# Patient Record
Sex: Male | Born: 1950 | Race: Black or African American | Hispanic: No | Smoking: Former smoker
Health system: Southern US, Community
[De-identification: ages and names within clinical notes are randomized; demographics above are authoritative.]

## PROBLEM LIST (undated history)

## (undated) DIAGNOSIS — C61 Malignant neoplasm of prostate: Secondary | ICD-10-CM

## (undated) DIAGNOSIS — F329 Major depressive disorder, single episode, unspecified: Secondary | ICD-10-CM

## (undated) DIAGNOSIS — Z72820 Sleep deprivation: Secondary | ICD-10-CM

## (undated) DIAGNOSIS — F419 Anxiety disorder, unspecified: Secondary | ICD-10-CM

## (undated) DIAGNOSIS — R569 Unspecified convulsions: Secondary | ICD-10-CM

## (undated) DIAGNOSIS — F32A Depression, unspecified: Secondary | ICD-10-CM

## (undated) HISTORY — PX: PROSTATE SURGERY: SHX751

---

## 1999-03-24 ENCOUNTER — Encounter: Payer: Self-pay | Admitting: Emergency Medicine

## 1999-03-24 ENCOUNTER — Emergency Department (HOSPITAL_COMMUNITY): Admission: EM | Admit: 1999-03-24 | Discharge: 1999-03-24 | Payer: Self-pay | Admitting: Emergency Medicine

## 1999-04-04 ENCOUNTER — Ambulatory Visit (HOSPITAL_BASED_OUTPATIENT_CLINIC_OR_DEPARTMENT_OTHER): Admission: RE | Admit: 1999-04-04 | Discharge: 1999-04-04 | Payer: Self-pay | Admitting: Orthopedic Surgery

## 2001-04-21 ENCOUNTER — Ambulatory Visit (HOSPITAL_COMMUNITY): Admission: RE | Admit: 2001-04-21 | Discharge: 2001-04-21 | Payer: Self-pay | Admitting: Orthopedic Surgery

## 2001-04-21 ENCOUNTER — Encounter: Payer: Self-pay | Admitting: Orthopedic Surgery

## 2002-02-04 ENCOUNTER — Encounter: Payer: Self-pay | Admitting: Emergency Medicine

## 2002-02-04 ENCOUNTER — Emergency Department (HOSPITAL_COMMUNITY): Admission: EM | Admit: 2002-02-04 | Discharge: 2002-02-04 | Payer: Self-pay | Admitting: Emergency Medicine

## 2010-04-09 ENCOUNTER — Emergency Department (HOSPITAL_COMMUNITY): Admission: EM | Admit: 2010-04-09 | Discharge: 2010-04-09 | Payer: Self-pay | Admitting: Emergency Medicine

## 2010-08-02 ENCOUNTER — Inpatient Hospital Stay (HOSPITAL_COMMUNITY): Admission: AC | Admit: 2010-08-02 | Discharge: 2010-08-09 | Payer: Self-pay | Source: Home / Self Care

## 2010-08-07 ENCOUNTER — Ambulatory Visit: Payer: Self-pay | Admitting: Physical Medicine & Rehabilitation

## 2011-02-23 LAB — RAPID URINE DRUG SCREEN, HOSP PERFORMED
Amphetamines: NOT DETECTED
Barbiturates: NOT DETECTED
Opiates: NOT DETECTED

## 2011-02-23 LAB — BASIC METABOLIC PANEL
BUN: 4 mg/dL — ABNORMAL LOW (ref 6–23)
BUN: 5 mg/dL — ABNORMAL LOW (ref 6–23)
CO2: 14 mEq/L — ABNORMAL LOW (ref 19–32)
CO2: 25 mEq/L (ref 19–32)
Calcium: 9.4 mg/dL (ref 8.4–10.5)
Calcium: 9.9 mg/dL (ref 8.4–10.5)
Chloride: 102 mEq/L (ref 96–112)
Chloride: 110 mEq/L (ref 96–112)
Creatinine, Ser: 0.87 mg/dL (ref 0.4–1.5)
Creatinine, Ser: 0.93 mg/dL (ref 0.4–1.5)
Creatinine, Ser: 1.06 mg/dL (ref 0.4–1.5)
GFR calc Af Amer: >60 mL/min (ref >60)
GFR calc non Af Amer: >60 mL/min (ref >60)
GFR calc non Af Amer: >60 mL/min (ref >60)
GFR calc non Af Amer: >60 mL/min (ref >60)
GFR calc non Af Amer: >60 mL/min (ref >60)
Glucose, Bld: 112 mg/dL — ABNORMAL HIGH (ref 70–99)
Glucose, Bld: 122 mg/dL — ABNORMAL HIGH (ref 70–99)
Glucose, Bld: 135 mg/dL — ABNORMAL HIGH (ref 70–99)
Potassium: 3.5 mEq/L (ref 3.5–5.1)
Sodium: 140 mEq/L (ref 135–145)

## 2011-02-23 LAB — URINALYSIS, ROUTINE W REFLEX MICROSCOPIC
Bilirubin Urine: NEGATIVE
Leukocytes, UA: NEGATIVE
Protein, ur: 30 mg/dL — AB
Specific Gravity, Urine: 1.016 (ref 1.005–1.030)
Urobilinogen, UA: 0.2 mg/dL (ref 0.0–1.0)
pH: 5.5 (ref 5.0–8.0)

## 2011-02-23 LAB — CBC
HCT: 38.4 % — ABNORMAL LOW (ref 39.0–52.0)
HCT: 42.1 % (ref 39.0–52.0)
Hemoglobin: 14.9 g/dL (ref 13.0–17.0)
Hemoglobin: 15.8 g/dL (ref 13.0–17.0)
MCH: 32.6 pg (ref 26.0–34.0)
MCH: 33.5 pg (ref 26.0–34.0)
MCHC: 35.2 g/dL (ref 30.0–36.0)
MCHC: 35.4 g/dL (ref 30.0–36.0)
MCHC: 35.4 g/dL (ref 30.0–36.0)
MCHC: 36.2 g/dL — ABNORMAL HIGH (ref 30.0–36.0)
MCV: 92.8 fL (ref 78.0–100.0)
MCV: 94.6 fL (ref 78.0–100.0)
RBC: 4.45 MIL/uL (ref 4.22–5.81)
RDW: 12.2 % (ref 11.5–15.5)
RDW: 12.5 % (ref 11.5–15.5)
RDW: 12.6 % (ref 11.5–15.5)
WBC: 13.5 10*3/uL — ABNORMAL HIGH (ref 4.0–10.5)

## 2011-02-23 LAB — URINE CULTURE: Culture  Setup Time: 201108250858

## 2011-02-23 LAB — TYPE AND SCREEN: ABO/RH(D): O POS

## 2011-02-23 LAB — COMPREHENSIVE METABOLIC PANEL
ALT: 29 U/L (ref 0–53)
AST: 64 U/L — ABNORMAL HIGH (ref 0–37)
Albumin: 4.4 g/dL (ref 3.5–5.2)
CO2: 18 mEq/L — ABNORMAL LOW (ref 19–32)
GFR calc Af Amer: >60 mL/min (ref >60)
GFR calc non Af Amer: 56 mL/min — ABNORMAL LOW (ref >60)
Sodium: 139 mEq/L (ref 135–145)

## 2011-02-23 LAB — ABO/RH: ABO/RH(D): O POS

## 2011-02-23 LAB — URINE MICROSCOPIC-ADD ON

## 2011-02-23 LAB — POCT I-STAT, CHEM 8
Creatinine, Ser: 1.7 mg/dL — ABNORMAL HIGH (ref 0.4–1.5)
HCT: 48 % (ref 39.0–52.0)
TCO2: 15 mmol/L (ref 0–100)

## 2011-02-23 LAB — ETHANOL: Alcohol, Ethyl (B): 219 mg/dL — ABNORMAL HIGH (ref 0–10)

## 2011-02-23 LAB — PROTIME-INR: Prothrombin Time: 13.2 seconds (ref 11.6–15.2)

## 2011-02-27 LAB — POCT I-STAT 3, ART BLOOD GAS (G3+)
Acid-base deficit: 2 mmol/L (ref 0.0–2.0)
pCO2 arterial: 35.4 mmHg (ref 35.0–45.0)
pO2, Arterial: 178 mmHg — ABNORMAL HIGH (ref 80.0–100.0)

## 2011-02-27 LAB — CARBOXYHEMOGLOBIN: Carboxyhemoglobin: 1.4 % (ref 0.5–1.5)

## 2011-04-27 NOTE — H&P (Signed)
[place]  Patient:    Roger Savage, Roger Savage Visit Number: [id number] MRN: [id number]          Service Type: MED Location: [id number] Attending Physician:  Roger Savage Dictated by:   Roger Savage, M.D. Admit Date:  02/04/2002                           History and Physical  CHIEF COMPLAINT: Larey Seat, hit head, passed out, confused.  HISTORY OF PRESENT ILLNESS: Roger Savage is a 60 year old black male, who was line at [facility] [employer] when he slipped on a rug and fell backward and hit his head on a hard surface floor.  He felt he would pass out, stool up and walked outside, and he did pass out.  He had some vomiting after this.  He was transported to [facility] Emergency Room by EMS.  Was noted to be very slow to respond or inappropriate, and decision was made to admit because of his examination.  He does complain of some blurred vision, headache, and slow thinking.  PAST MEDICAL HISTORY: Left hand surgery in 2000 by Dr. Teressa Savage for a comminuted fracture.  ALLERGIES: NKDA.  MEDICATIONS: None.  FAMILY HISTORY: Negative for diabetes, hypertension, CAD, asthma, cancer, or stroke.  SOCIAL HISTORY: He works for a [employer].  Single.  Three children, ages 32, 72, and 69.  He smokes one pack of cigarettes per week.  Rare alcohol.  No drugs.  REVIEW OF SYSTEMS: Positive for headache, positive for blurred vision, positive for nausea and vomiting after the fall as well as left arm weakness. He has no complaints of rhinorrhea, shortness of breath, chest pain, abdominal pain, diarrhea, BRBPR, edema, rash, loss of appetite, dysuria, frequency, hematuria, or fever.  PHYSICAL EXAMINATION:  VITAL SIGNS: TEMP 97.0 degrees, BP 152/101, pulse 61, respirations 18.  Pulse oximetry 100% on room air.  GENERAL: NAD.  SKIN: No lesions. Warm, dry.  HEENT: TMs clear.  EOMI.  No papilledema.  No conjunctivae injection. Oropharynx  clear.  Good dentition.  Moist mucosa.  No erythema or exudate.  NECK: Slight decrease in forward flexion.  Mild posterior neck pain with head movement.  Slight tenderness posteriorly.  No TM or adenopathy.  No JVD or bruits.  LUNGS: Clear to auscultation bilaterally.  No wheezes or crackles.  Good air movement.  BACK: No CVAT.  CV: RRR.  S1 and S2.  No MHR.  ABDOMEN: Positive bowel sounds, NT/ND.  No HSM or masses.  GU: NEMG.  Circumcised.  Descended testicles.  No hernia, no masses.  RECTAL: Guaiac negative.  Normal tone.  Firm, small prostate, nontender, no masses or nodules.  EXTREMITIES: Pulses 2+.  No CCE.  NEUROLOGIC: Alert and oriented to person, place, month and year, but takes him about two minutes to remember the month and year together.  Cranial nerves II-XII intact.  Motor 5/5.  Decreased sensation to fine touch to the left hand and distal forearm, left foot and left ankle.  Left blurred vision.  Downgoing right toe.  Equivocal left toe.  LABORATORY DATA: Hemoglobin 14.5, hematocrit 41.5, WBC 4.4; platelets 191,000. Drug screen positive for cocaine.  UA shows 500 glucose, otherwise normal.  CT read as normal by radiologist.  EKG, normal sinus rhythm.  ASSESSMENT/PLAN: This is a 60 year old with concussion, definite findings on neurologic examination to point to possible right cerebral deficit.  He will be admitted,  monitored, and MRI follow-up for further evaluation - especially with the weakness on the left and blurred vision on the left.  Frequent neurologic checks.  Also, blood pressure and glucose are to be monitored. Dictated by:   Roger Savage, M.D. Attending Physician:  Roger Savage DD:  02/04/02 TD:  02/05/02 Job: [id number]

## 2012-07-06 ENCOUNTER — Encounter (HOSPITAL_COMMUNITY): Payer: Self-pay | Admitting: *Deleted

## 2012-07-06 ENCOUNTER — Emergency Department (HOSPITAL_COMMUNITY)
Admission: EM | Admit: 2012-07-06 | Discharge: 2012-07-06 | Disposition: A | Discharge status: Home / Self Care | Payer: Self-pay | Attending: Emergency Medicine | Admitting: Emergency Medicine

## 2012-07-06 DIAGNOSIS — R350 Frequency of micturition: Secondary | ICD-10-CM | POA: Insufficient documentation

## 2012-07-06 DIAGNOSIS — R300 Dysuria: Secondary | ICD-10-CM

## 2012-07-06 DIAGNOSIS — R109 Unspecified abdominal pain: Secondary | ICD-10-CM | POA: Insufficient documentation

## 2012-07-06 DIAGNOSIS — R32 Unspecified urinary incontinence: Secondary | ICD-10-CM | POA: Insufficient documentation

## 2012-07-06 HISTORY — DX: Malignant neoplasm of prostate: C61

## 2012-07-06 HISTORY — DX: Depression, unspecified: F32.A

## 2012-07-06 HISTORY — DX: Major depressive disorder, single episode, unspecified: F32.9

## 2012-07-06 HISTORY — DX: Anxiety disorder, unspecified: F41.9

## 2012-07-06 HISTORY — DX: Sleep deprivation: Z72.820

## 2012-07-06 LAB — URINE MICROSCOPIC-ADD ON

## 2012-07-06 LAB — URINALYSIS, ROUTINE W REFLEX MICROSCOPIC
Bilirubin Urine: NEGATIVE
Glucose, UA: NEGATIVE mg/dL
Specific Gravity, Urine: 1.019 (ref 1.005–1.030)
Urobilinogen, UA: 0.2 mg/dL (ref 0.0–1.0)

## 2012-07-06 LAB — POCT I-STAT, CHEM 8
BUN: 12 mg/dL (ref 6–23)
Calcium, Ion: 1.21 mmol/L (ref 1.12–1.23)
Chloride: 107 mEq/L (ref 96–112)
Creatinine, Ser: 1.1 mg/dL (ref 0.50–1.35)
Glucose, Bld: 98 mg/dL (ref 70–99)
Potassium: 4.6 mEq/L (ref 3.5–5.1)

## 2012-07-06 MED ORDER — SULFAMETHOXAZOLE-TRIMETHOPRIM 800-160 MG PO TABS: 1.0000 | ORAL_TABLET | Freq: Two times a day (BID) | ORAL | Status: AC

## 2012-07-06 MED ORDER — SULFAMETHOXAZOLE-TMP DS 800-160 MG PO TABS
1.0000 | ORAL_TABLET | Freq: Once | ORAL | Status: AC
  Administered 2012-07-06: 1 via ORAL
  Filled 2012-07-06: qty 1

## 2012-07-06 MED ORDER — OXYCODONE-ACETAMINOPHEN 5-325 MG PO TABS: 2.0000 | ORAL_TABLET | ORAL | Status: AC | PRN

## 2012-07-06 MED ORDER — MORPHINE SULFATE 4 MG/ML IJ SOLN
4.0000 mg | Freq: Once | INTRAMUSCULAR | Status: AC
  Administered 2012-07-06: 4 mg via INTRAMUSCULAR
  Filled 2012-07-06: qty 1

## 2012-07-06 NOTE — ED Provider Notes (Addendum)
History     CSN: [id number]  Arrival date & time 07/06/12  1249   First MD Initiated Contact with Patient 07/06/12 1337      Chief Complaint  Patient presents with  . Urinary Frequency    Post prostate surgery March 31, 2012  . Dysuria    (Consider location/radiation/quality/duration/timing/severity/associated sxs/prior treatment) Patient is a 61 y.o. male presenting with frequency and dysuria. The history is provided by the patient and the spouse.  Urinary Frequency Associated symptoms include abdominal pain. Pertinent negatives include no headaches.  Dysuria  Associated symptoms include frequency. Pertinent negatives include no chills, no nausea, no vomiting and no hematuria.  PT had  Prostate surgery in April at the [place].  Now he has lower abd pain along with intermittent inability to void followed by severe dysuria.  He denies hematuria.  He denies f/c/n/v.  He wears adult diapers because he is incontinent now.  He also has erectile dysfx since the surgery.    Past Medical History  Diagnosis Date  . Prostate cancer   . Anxiety   . Depression   . Sleep deprivation     Past Surgical History  Procedure Date  . Prostate surgery     No family history on file.  History  Substance Use Topics  . Smoking status: Former Games developer  . Smokeless tobacco: Not on file  . Alcohol Use: 3.6 oz/week    6 Cans of beer per week      Review of Systems  Constitutional: Negative for fever and chills.  Gastrointestinal: Positive for abdominal pain. Negative for nausea and vomiting.  Genitourinary: Positive for dysuria and frequency. Negative for hematuria.  Musculoskeletal: Negative for back pain.  Neurological: Negative for headaches.  Psychiatric/Behavioral: Negative for confusion.  All other systems reviewed and are negative.    Allergies  Review of patient's allergies indicates no known allergies.  Home Medications   Current Outpatient Rx  Name Route Sig Dispense Refill    . VITAMIN D 1000 UNITS PO TABS Oral Take 2,000 Units by mouth daily.      BP 135/100  Pulse 85  Temp 98.5 F (36.9 C) (Oral)  Resp 19  Wt 170 lb (77.111 kg)  SpO2 99%  Physical Exam  Nursing note and vitals reviewed. Constitutional: He is oriented to person, place, and time. He appears well-developed and well-nourished. No distress.  HENT:  Head: Normocephalic and atraumatic.  Eyes: EOM are normal.  Neck: Normal range of motion.  Pulmonary/Chest: Effort normal.  Abdominal: Soft. He exhibits no distension. There is tenderness. There is no rebound and no guarding.       "pressure" over suprapubic area with palpation No peritoneal signs  Genitourinary: Penis normal. No penile tenderness.  Musculoskeletal: Normal range of motion.  Neurological: He is alert and oriented to person, place, and time.  Skin: Skin is warm and dry.  Psychiatric: He has a normal mood and affect. Thought content normal.    ED Course  Procedures (including critical care time)post prostate surgery urinary incontinence and dysuria   Labs Reviewed  URINALYSIS, ROUTINE W REFLEX MICROSCOPIC   No results found.   No diagnosis found.    MDM  Incontinence Dysuria with pyuria.  Will place on abxs.   ucx pending.   Pain controlled in ed. No signs systemic illness.        Cheri Guppy, MD 07/06/12 1413  Cheri Guppy, MD 07/06/12 [phone]

## 2012-07-06 NOTE — ED Notes (Signed)
Pt states "had prostate surgery @ the [place] in April, blood was coming, had a burning sensation down there, it finally broke free this morning, can't control it, just peed on myself"

## 2012-07-06 NOTE — ED Notes (Signed)
Pt aware of the need for a urine sample. 

## 2012-07-08 ENCOUNTER — Encounter (HOSPITAL_COMMUNITY): Payer: Self-pay | Admitting: *Deleted

## 2012-07-08 ENCOUNTER — Emergency Department (HOSPITAL_COMMUNITY)
Admission: EM | Admit: 2012-07-08 | Discharge: 2012-07-08 | Disposition: A | Discharge status: Home / Self Care | Payer: Self-pay | Attending: Emergency Medicine | Admitting: Emergency Medicine

## 2012-07-08 DIAGNOSIS — Z87891 Personal history of nicotine dependence: Secondary | ICD-10-CM | POA: Insufficient documentation

## 2012-07-08 DIAGNOSIS — C61 Malignant neoplasm of prostate: Secondary | ICD-10-CM | POA: Insufficient documentation

## 2012-07-08 DIAGNOSIS — R339 Retention of urine, unspecified: Secondary | ICD-10-CM | POA: Insufficient documentation

## 2012-07-08 DIAGNOSIS — F341 Dysthymic disorder: Secondary | ICD-10-CM | POA: Insufficient documentation

## 2012-07-08 LAB — POCT I-STAT, CHEM 8
Chloride: 110 mEq/L (ref 96–112)
Creatinine, Ser: 1.4 mg/dL — ABNORMAL HIGH (ref 0.50–1.35)
Glucose, Bld: 104 mg/dL — ABNORMAL HIGH (ref 70–99)
Hemoglobin: 17 g/dL (ref 13.0–17.0)
Potassium: 4.3 mEq/L (ref 3.5–5.1)
Sodium: 142 mEq/L (ref 135–145)

## 2012-07-08 LAB — URINE MICROSCOPIC-ADD ON

## 2012-07-08 LAB — URINALYSIS, ROUTINE W REFLEX MICROSCOPIC
Bilirubin Urine: NEGATIVE
Glucose, UA: NEGATIVE mg/dL
Specific Gravity, Urine: 1.021 (ref 1.005–1.030)
Urobilinogen, UA: 0.2 mg/dL (ref 0.0–1.0)
pH: 6.5 (ref 5.0–8.0)

## 2012-07-08 MED ORDER — OXYCODONE-ACETAMINOPHEN 5-325 MG PO TABS: 2.0000 | ORAL_TABLET | ORAL | Status: AC | PRN

## 2012-07-08 MED ORDER — LIDOCAINE HCL 2 % EX GEL
Freq: Once | CUTANEOUS | Status: AC
  Administered 2012-07-08: 15:00:00 via URETHRAL
  Filled 2012-07-08: qty 10

## 2012-07-08 MED ORDER — MORPHINE SULFATE 4 MG/ML IJ SOLN
8.0000 mg | Freq: Once | INTRAMUSCULAR | Status: AC
  Administered 2012-07-08: 8 mg via INTRAMUSCULAR
  Filled 2012-07-08: qty 2

## 2012-07-08 NOTE — ED Notes (Signed)
MD at bedside. 

## 2012-07-08 NOTE — Progress Notes (Signed)
CM spoke with pt who confirms self pay [place] resident with no pcp. Discussed the importance of a pcp for f/u. Reviewed Health connect number to assist with finding self pay provider close to pt's residence. Reviewed resources for [facility], general medial clinics, medications-needymeds.[email or web address], housing, DSS, health Department and other resources in [place] including crisis programs Pt voiced understanding and appreciation of resources provided 

## 2012-07-08 NOTE — ED Notes (Signed)
Pt reports pain r/t prostate Ca since 4am today. Sts began leaking urine approx 1.5 hrs ago. Denies blood in urine.

## 2012-07-08 NOTE — ED Notes (Signed)
Dr. Ethelda Chick placed a 16 french coude in the patient at 1510.

## 2012-07-08 NOTE — ED Notes (Signed)
Pt reports pain, diaphoresis and elevated b/p. Feels dizzy/lightheaded

## 2012-07-08 NOTE — ED Provider Notes (Signed)
History     CSN: [id number]  Arrival date & time 07/08/12  1250   First MD Initiated Contact with Patient 07/08/12 1403      Chief Complaint  Patient presents with  . prostate cancer pain     (Consider location/radiation/quality/duration/timing/severity/associated sxs/prior treatment) HPI Complains of difficulty urinating and feeling of inability to add the bladder for 2 days. He is dribbling small amounts of urine. No other associated symptoms no fever no treatment prior to coming here nothing makes symptoms better or worse. Complains of pain at suprapubic area. Pressure-like in quality and severe at present Past Medical History  Diagnosis Date  . Prostate cancer   . Anxiety   . Depression   . Sleep deprivation     Past Surgical History  Procedure Date  . Prostate surgery     No family history on file.  History  Substance Use Topics  . Smoking status: Former Games developer  . Smokeless tobacco: Not on file  . Alcohol Use: 3.6 oz/week    6 Cans of beer per week      Review of Systems  Constitutional: Negative.   HENT: Negative.   Respiratory: Negative.   Cardiovascular: Negative.   Gastrointestinal: Negative.   Genitourinary: Positive for dysuria and difficulty urinating.  Musculoskeletal: Negative.   Skin: Negative.   Neurological: Negative.   Hematological: Negative.   Psychiatric/Behavioral: Negative.     Allergies  Review of patient's allergies indicates no known allergies.  Home Medications   Current Outpatient Rx  Name Route Sig Dispense Refill  . OXYCODONE-ACETAMINOPHEN 5-325 MG PO TABS Oral Take 2 tablets by mouth every 4 (four) hours as needed for pain. 6 tablet 0  . SULFAMETHOXAZOLE-TRIMETHOPRIM 800-160 MG PO TABS Oral Take 1 tablet by mouth every 12 (twelve) hours. 20 tablet 0  . VITAMIN D 1000 UNITS PO TABS Oral Take 2,000 Units by mouth daily.      BP 176/98  Pulse 107  Temp 97.8 F (36.6 C) (Oral)  Resp 24  SpO2 100%  Physical Exam    Nursing note and vitals reviewed. Constitutional: He appears well-developed and well-nourished. He appears distressed.       Appears uncomfortable  HENT:  Head: Normocephalic and atraumatic.  Eyes: Conjunctivae are normal. Pupils are equal, round, and reactive to light.  Neck: Neck supple. No tracheal deviation present. No thyromegaly present.  Cardiovascular: Normal rate and regular rhythm.   No murmur heard. Pulmonary/Chest: Effort normal and breath sounds normal.  Abdominal: Soft. Bowel sounds are normal. He exhibits no distension. There is no tenderness.  Genitourinary: Penis normal.  Musculoskeletal: Normal range of motion. He exhibits no edema and no tenderness.  Neurological: He is alert. Coordination normal.  Skin: Skin is warm and dry. No rash noted.  Psychiatric: He has a normal mood and affect.    ED Course  Procedures (including critical care time)  Labs Reviewed  GLUCOSE, CAPILLARY - Abnormal; Notable for the following:    Glucose-Capillary 110 (*)     All other components within normal limits  URINALYSIS, ROUTINE W REFLEX MICROSCOPIC   No results found. Procedure :ED tech unable to insert foley. I inserted 16 Frcoude catheter after area prepped wih betadynnne , draped urethra injected with lidocaine jelly. baollon iflated to 10 ml with sterile water. 350 ml urinee came out of foley with relief. No diagnosis found.  Results for orders placed during the hospital encounter of 07/08/12  GLUCOSE, CAPILLARY      Component Value Range  Glucose-Capillary 110 (*) 70 - 99 mg/dL  URINALYSIS, ROUTINE W REFLEX MICROSCOPIC      Component Value Range   Color, Urine YELLOW  YELLOW   APPearance CLEAR  CLEAR   Specific Gravity, Urine 1.021  1.005 - 1.030   pH 6.5  5.0 - 8.0   Glucose, UA NEGATIVE  NEGATIVE mg/dL   Hgb urine dipstick SMALL (*) NEGATIVE   Bilirubin Urine NEGATIVE  NEGATIVE   Ketones, ur NEGATIVE  NEGATIVE mg/dL   Protein, ur NEGATIVE  NEGATIVE mg/dL    Urobilinogen, UA 0.2  0.0 - 1.0 mg/dL   Nitrite NEGATIVE  NEGATIVE   Leukocytes, UA MODERATE (*) NEGATIVE  POCT I-STAT, CHEM 8      Component Value Range   Sodium 142  135 - 145 mEq/L   Potassium 4.3  3.5 - 5.1 mEq/L   Chloride 110  96 - 112 mEq/L   BUN 11  6 - 23 mg/dL   Creatinine, Ser 4.69 (*) 0.50 - 1.35 mg/dL   Glucose, Bld 629 (*) 70 - 99 mg/dL   Calcium, Ion 5.28  4.13 - 1.23 mmol/L   TCO2 21  0 - 100 mmol/L   Hemoglobin 17.0  13.0 - 17.0 g/dL   HCT 24.4  01.0 - 27.2 %  URINE MICROSCOPIC-ADD ON      Component Value Range   Squamous Epithelial / LPF RARE  RARE   WBC, UA 11-20  <3 WBC/hpf   RBC / HPF 3-6  <3 RBC/hpf   Bacteria, UA RARE  RARE   No results found.   MDM  Plan continue bactrim, rx percocet, foley to go, urology f/u within 1 weeks Dx #1 urinary retention  #2 renal insufficiciiency        Doug Sou, MD 07/08/12 [phone]

## 2012-07-09 LAB — URINE CULTURE: Special Requests: NORMAL

## 2012-07-10 NOTE — ED Notes (Signed)
+  Urine. Patient treated with Septra DS. Sensitive to same. Per protocol MD. °

## 2013-05-05 ENCOUNTER — Encounter (HOSPITAL_COMMUNITY): Payer: Self-pay

## 2013-05-05 ENCOUNTER — Emergency Department (HOSPITAL_COMMUNITY)
Admission: EM | Admit: 2013-05-05 | Discharge: 2013-05-05 | Disposition: A | Discharge status: Home / Self Care | Payer: Self-pay | Attending: Emergency Medicine | Admitting: Emergency Medicine

## 2013-05-05 DIAGNOSIS — F911 Conduct disorder, childhood-onset type: Secondary | ICD-10-CM | POA: Insufficient documentation

## 2013-05-05 DIAGNOSIS — Z87891 Personal history of nicotine dependence: Secondary | ICD-10-CM | POA: Insufficient documentation

## 2013-05-05 DIAGNOSIS — C61 Malignant neoplasm of prostate: Secondary | ICD-10-CM | POA: Insufficient documentation

## 2013-05-05 DIAGNOSIS — F329 Major depressive disorder, single episode, unspecified: Secondary | ICD-10-CM | POA: Insufficient documentation

## 2013-05-05 DIAGNOSIS — F411 Generalized anxiety disorder: Secondary | ICD-10-CM | POA: Insufficient documentation

## 2013-05-05 DIAGNOSIS — Z79899 Other long term (current) drug therapy: Secondary | ICD-10-CM | POA: Insufficient documentation

## 2013-05-05 DIAGNOSIS — R300 Dysuria: Secondary | ICD-10-CM | POA: Insufficient documentation

## 2013-05-05 DIAGNOSIS — Z72820 Sleep deprivation: Secondary | ICD-10-CM | POA: Insufficient documentation

## 2013-05-05 DIAGNOSIS — F3289 Other specified depressive episodes: Secondary | ICD-10-CM | POA: Insufficient documentation

## 2013-05-05 LAB — URINE MICROSCOPIC-ADD ON

## 2013-05-05 LAB — URINALYSIS, ROUTINE W REFLEX MICROSCOPIC
Glucose, UA: NEGATIVE mg/dL
Specific Gravity, Urine: 1.019 (ref 1.005–1.030)
pH: 6.5 (ref 5.0–8.0)

## 2013-05-05 MED ORDER — PHENAZOPYRIDINE HCL 200 MG PO TABS: 200.0000 mg | ORAL_TABLET | Freq: Three times a day (TID) | ORAL | Status: DC | PRN

## 2013-05-05 MED ORDER — TRAMADOL HCL 50 MG PO TABS: 50.0000 mg | ORAL_TABLET | Freq: Four times a day (QID) | ORAL | Status: DC | PRN

## 2013-05-05 MED ORDER — HYDROCODONE-ACETAMINOPHEN 5-325 MG PO TABS
1.0000 | ORAL_TABLET | Freq: Once | ORAL | Status: AC
  Administered 2013-05-05: 1 via ORAL
  Filled 2013-05-05: qty 1

## 2013-05-05 NOTE — ED Notes (Signed)
Roger Savage unable to pass coude catheter-- Dr. Ranae Palms aware.

## 2013-05-05 NOTE — ED Notes (Addendum)
Pt presents with trouble urinating. Pt says he has been unable to fully urinate since Friday. Pt says he has only been "leaking" since Friday of last week. Pt says that today the "leaking" has almost completely stopped and he is only "dripping" at this point. Pt says he is feeling pain in his penis and rectum right now.

## 2013-05-05 NOTE — ED Notes (Signed)
Attempted to insert 30fr foley catheter. Resistance met multiple times. Foley removed. Dr. Ranae Palms made aware. Verbal order given to insert coude cath if department RN's could assist. Rayford Halsted RN in to attempt coude insertion.

## 2013-05-05 NOTE — ED Provider Notes (Signed)
History     CSN: [id number]  Arrival date & time 05/05/13  1632   First MD Initiated Contact with Patient 05/05/13 1656      Chief Complaint  Patient presents with  . Urinary Retention    (Consider location/radiation/quality/duration/timing/severity/associated sxs/prior treatment) HPI Pt present demanding catheter be placed. He is having ongoing urinary incontinence and dribbling. He is s/p prostate resection and is followed by the [place]. He had catheter up until several months ago. No fever, chills. He has chronic pain. States he urinated prior to arriving in ED.  Past Medical History  Diagnosis Date  . Prostate cancer   . Anxiety   . Depression   . Sleep deprivation     Past Surgical History  Procedure Laterality Date  . Prostate surgery      No family history on file.  History  Substance Use Topics  . Smoking status: Former Games developer  . Smokeless tobacco: Not on file  . Alcohol Use: 3.6 oz/week    6 Cans of beer per week      Review of Systems  Constitutional: Negative for fever and chills.  Gastrointestinal: Negative for nausea, vomiting, abdominal pain and diarrhea.  Genitourinary: Positive for difficulty urinating. Negative for dysuria and hematuria.  Musculoskeletal: Negative for back pain.  Skin: Negative for rash and wound.  All other systems reviewed and are negative.    Allergies  Review of patient's allergies indicates no known allergies.  Home Medications   Current Outpatient Rx  Name  Route  Sig  Dispense  Refill  . cholecalciferol (VITAMIN D) 1000 UNITS tablet   Oral   Take 2,000 Units by mouth daily.         Marland Kitchen FLUoxetine (PROZAC) 40 MG capsule   Oral   Take 40 mg by mouth daily.         Marland Kitchen HYDROcodone-acetaminophen (NORCO/VICODIN) 5-325 MG per tablet   Oral   Take 1 tablet by mouth every 6 (six) hours as needed for pain.         . traZODone (DESYREL) 150 MG tablet   Oral   Take 150 mg by mouth at bedtime.         .  phenazopyridine (PYRIDIUM) 200 MG tablet   Oral   Take 1 tablet (200 mg total) by mouth 3 (three) times daily as needed for pain.   6 tablet   0   . traMADol (ULTRAM) 50 MG tablet   Oral   Take 1 tablet (50 mg total) by mouth every 6 (six) hours as needed for pain.   15 tablet   0     BP 140/79  Pulse 59  Temp(Src) 98.6 F (37 C) (Oral)  Resp 24  Ht 5\' 9"  (1.753 m)  Wt 170 lb (77.111 kg)  BMI 25.09 kg/m2  SpO2 99%  Physical Exam  Nursing note and vitals reviewed. Constitutional: He is oriented to person, place, and time. He appears well-developed and well-nourished. No distress.  HENT:  Head: Normocephalic and atraumatic.  Mouth/Throat: Oropharynx is clear and moist.  Eyes: EOM are normal. Pupils are equal, round, and reactive to light.  Neck: Normal range of motion. Neck supple.  Cardiovascular: Normal rate and regular rhythm.   Pulmonary/Chest: Effort normal and breath sounds normal. No respiratory distress. He has no wheezes. He has no rales.  Abdominal: Soft. Bowel sounds are normal. He exhibits no distension and no mass. There is no tenderness. There is no rebound and no guarding.  Genitourinary: Penis normal.  Musculoskeletal: Normal range of motion. He exhibits no edema and no tenderness.  Neurological: He is alert and oriented to person, place, and time.  Skin: Skin is warm and dry. No rash noted. No erythema.  Psychiatric:  Pt is aggressive and demanding.     ED Course  Procedures (including critical care time)  Labs Reviewed  URINALYSIS, ROUTINE W REFLEX MICROSCOPIC - Abnormal; Notable for the following:    APPearance CLOUDY (*)    Hgb urine dipstick LARGE (*)    Protein, ur 100 (*)    Leukocytes, UA SMALL (*)    All other components within normal limits  URINE MICROSCOPIC-ADD ON - Abnormal; Notable for the following:    Squamous Epithelial / LPF FEW (*)    Bacteria, UA FEW (*)    All other components within normal limits  URINE CULTURE   No  results found.   1. Dysuria       MDM  Bladder scan reveals approx 115 cc in bladder. Will attempt catheter, check urine for infection and [facility].  Unsuccessful passage of cath and coude cath. Pt continues to urinate in ED. UA neg for infection. Discussed with Dr Isabel Caprice. 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 Suggest pyridium and if he wants a second opinion can f/u with him as an outpatient but suggests he continue with the [facility] urologist.   Pt and his sister were angry and demanding someone be called in and demanding pain medication. Pt stood in threatening manner making aggressive gestures. Will give single dose of hydrocodone and d/c home.        Loren Racer, MD 05/05/13 [phone]

## 2013-05-06 LAB — URINE CULTURE

## 2014-01-09 ENCOUNTER — Inpatient Hospital Stay (HOSPITAL_COMMUNITY)
Admission: EM | Admit: 2014-01-09 | Discharge: 2014-01-12 | DRG: 101 | Disposition: A | Discharge status: Home / Self Care | Payer: Non-veteran care | Attending: Internal Medicine | Admitting: Internal Medicine

## 2014-01-09 ENCOUNTER — Emergency Department (HOSPITAL_COMMUNITY): Payer: Non-veteran care

## 2014-01-09 ENCOUNTER — Encounter (HOSPITAL_COMMUNITY): Payer: Self-pay | Admitting: Emergency Medicine

## 2014-01-09 DIAGNOSIS — F329 Major depressive disorder, single episode, unspecified: Secondary | ICD-10-CM | POA: Diagnosis present

## 2014-01-09 DIAGNOSIS — R569 Unspecified convulsions: Principal | ICD-10-CM | POA: Diagnosis present

## 2014-01-09 DIAGNOSIS — F3289 Other specified depressive episodes: Secondary | ICD-10-CM | POA: Diagnosis present

## 2014-01-09 DIAGNOSIS — Z87891 Personal history of nicotine dependence: Secondary | ICD-10-CM

## 2014-01-09 DIAGNOSIS — Z8546 Personal history of malignant neoplasm of prostate: Secondary | ICD-10-CM

## 2014-01-09 DIAGNOSIS — Z79899 Other long term (current) drug therapy: Secondary | ICD-10-CM

## 2014-01-09 DIAGNOSIS — E876 Hypokalemia: Secondary | ICD-10-CM | POA: Diagnosis not present

## 2014-01-09 DIAGNOSIS — D72829 Elevated white blood cell count, unspecified: Secondary | ICD-10-CM | POA: Diagnosis present

## 2014-01-09 DIAGNOSIS — G40901 Epilepsy, unspecified, not intractable, with status epilepticus: Secondary | ICD-10-CM

## 2014-01-09 DIAGNOSIS — F1910 Other psychoactive substance abuse, uncomplicated: Secondary | ICD-10-CM | POA: Diagnosis present

## 2014-01-09 DIAGNOSIS — Z72820 Sleep deprivation: Secondary | ICD-10-CM

## 2014-01-09 DIAGNOSIS — E872 Acidosis, unspecified: Secondary | ICD-10-CM | POA: Diagnosis present

## 2014-01-09 DIAGNOSIS — F1010 Alcohol abuse, uncomplicated: Secondary | ICD-10-CM | POA: Diagnosis present

## 2014-01-09 DIAGNOSIS — F411 Generalized anxiety disorder: Secondary | ICD-10-CM | POA: Diagnosis present

## 2014-01-09 DIAGNOSIS — D539 Nutritional anemia, unspecified: Secondary | ICD-10-CM | POA: Diagnosis present

## 2014-01-09 HISTORY — DX: Unspecified convulsions: R56.9

## 2014-01-09 LAB — COMPREHENSIVE METABOLIC PANEL
ALT: 20 U/L (ref 0–53)
AST: 39 U/L — AB (ref 0–37)
Albumin: 4.3 g/dL (ref 3.5–5.2)
Alkaline Phosphatase: 124 U/L — ABNORMAL HIGH (ref 39–117)
BUN: 4 mg/dL — ABNORMAL LOW (ref 6–23)
CALCIUM: 10 mg/dL (ref 8.4–10.5)
CREATININE: 1.03 mg/dL (ref 0.50–1.35)
Chloride: 96 mEq/L (ref 96–112)
GFR calc Af Amer: 89 mL/min — ABNORMAL LOW (ref >90)
GFR, EST NON AFRICAN AMERICAN: 76 mL/min — AB (ref >90)
Glucose, Bld: 139 mg/dL — ABNORMAL HIGH (ref 70–99)
Potassium: 4 mEq/L (ref 3.7–5.3)
SODIUM: 144 meq/L (ref 137–147)
TOTAL PROTEIN: 9.5 g/dL — AB (ref 6.0–8.3)
Total Bilirubin: 0.4 mg/dL (ref 0.3–1.2)

## 2014-01-09 LAB — POCT I-STAT 3, ART BLOOD GAS (G3+)
ACID-BASE DEFICIT: 20 mmol/L — AB (ref 0.0–2.0)
BICARBONATE: 7.3 meq/L — AB (ref 20.0–24.0)
O2 Saturation: 96 %
PCO2 ART: 20.7 mmHg — AB (ref 35.0–45.0)
Patient temperature: 97.7
TCO2: 8 mmol/L (ref 0–100)
pH, Arterial: 7.154 — CL (ref 7.350–7.450)
pO2, Arterial: 100 mmHg (ref 80.0–100.0)

## 2014-01-09 LAB — POCT I-STAT 3, VENOUS BLOOD GAS (G3P V)
Acid-base deficit: 26 mmol/L — ABNORMAL HIGH (ref 0.0–2.0)
BICARBONATE: 8 meq/L — AB (ref 20.0–24.0)
O2 Saturation: 98 %
PH VEN: 6.871 — AB (ref 7.250–7.300)
PO2 VEN: 170 mmHg — AB (ref 30.0–45.0)
TCO2: 9 mmol/L (ref 0–100)
pCO2, Ven: 43.3 mmHg — ABNORMAL LOW (ref 45.0–50.0)

## 2014-01-09 LAB — CBC
HEMATOCRIT: 47.9 % (ref 39.0–52.0)
HEMOGLOBIN: 16.8 g/dL (ref 13.0–17.0)
MCH: 36.1 pg — AB (ref 26.0–34.0)
MCHC: 35.1 g/dL (ref 30.0–36.0)
MCV: 102.8 fL — ABNORMAL HIGH (ref 78.0–100.0)
Platelets: 238 10*3/uL (ref 150–400)
RBC: 4.66 MIL/uL (ref 4.22–5.81)
RDW: 13.4 % (ref 11.5–15.5)
WBC: 13 10*3/uL — ABNORMAL HIGH (ref 4.0–10.5)

## 2014-01-09 LAB — ETHANOL: ALCOHOL ETHYL (B): 104 mg/dL — AB (ref 0–11)

## 2014-01-09 LAB — GLUCOSE, CAPILLARY: GLUCOSE-CAPILLARY: 135 mg/dL — AB (ref 70–99)

## 2014-01-09 LAB — LACTIC ACID, PLASMA: Lactic Acid, Venous: 22.5 mmol/L — ABNORMAL HIGH (ref 0.5–2.2)

## 2014-01-09 MED ORDER — LORAZEPAM 2 MG/ML IJ SOLN
INTRAMUSCULAR | Status: AC
  Administered 2014-01-09: 1 mg
  Filled 2014-01-09: qty 1

## 2014-01-09 MED ORDER — SODIUM CHLORIDE 0.9 % IV BOLUS (SEPSIS)
1000.0000 mL | Freq: Once | INTRAVENOUS | Status: AC
  Administered 2014-01-10: 1000 mL via INTRAVENOUS

## 2014-01-09 MED ORDER — LORAZEPAM 2 MG/ML IJ SOLN
INTRAMUSCULAR | Status: DC
Start: 2014-01-09 — End: 2014-01-10
  Filled 2014-01-09: qty 1

## 2014-01-09 MED ORDER — LORAZEPAM 2 MG/ML IJ SOLN
1.0000 mg | Freq: Once | INTRAMUSCULAR | Status: AC
  Administered 2014-01-09: 1 mg via INTRAVENOUS

## 2014-01-09 MED ORDER — SODIUM CHLORIDE 0.9 % IV SOLN
1000.0000 mg | Freq: Once | INTRAVENOUS | Status: AC
  Administered 2014-01-09: 1000 mg via INTRAVENOUS
  Filled 2014-01-09: qty 10

## 2014-01-09 MED ORDER — SODIUM CHLORIDE 0.9 % IV BOLUS (SEPSIS)
1000.0000 mL | Freq: Once | INTRAVENOUS | Status: AC
  Administered 2014-01-09: 1000 mL via INTRAVENOUS

## 2014-01-09 NOTE — ED Notes (Signed)
Pt beginning to lip smack again at looking back and forth, breathing deeper as he did before prior seizure. MD aware. Verbal to give 1mg  ativan.

## 2014-01-09 NOTE — ED Notes (Signed)
Pr transported to CT 

## 2014-01-09 NOTE — ED Provider Notes (Signed)
CSN: [id number]     Arrival date & time 01/09/14  2221 History   First MD Initiated Contact with Patient 01/09/14 2242     Chief Complaint  Patient presents with  . Seizures   (Consider location/radiation/quality/duration/timing/severity/associated sxs/prior Treatment) Patient is a 63 y.o. male presenting with seizures. The history is provided by the patient, the EMS personnel and a relative. The history is limited by the condition of the patient.  Seizures Seizure activity on arrival: yes   Seizure type:  Focal Preceding symptoms: hyperventilation and panic   Preceding symptoms: no sensation of an aura present   Initial focality:  Right-sided Episode characteristics: abnormal movements, confusion, disorientation, eye deviation, focal shaking, stiffening and unresponsiveness   Postictal symptoms: confusion and somnolence   Return to baseline: no   Severity:  Moderate Duration:  1 minute Timing:  Once Progression:  Partially resolved Context: drug use   Context: not alcohol withdrawal, not cerebral palsy, not change in medication, not family hx of seizures, not fever, not hydrocephalus, not intracranial lesion and not intracranial shunt   Recent head injury: No recent head injuries per patient. PTA treatment:  None History of seizures: no     63 year old male with no known known history of seizures. Patient with listed seizure history and his list past medical history. Patient with known alcoholism and multiple head injuries in the past. Family does not know any of his past medical history. Patient had been on an alcohol binge.  Past Medical History  Diagnosis Date  . Prostate cancer   . Anxiety   . Depression   . Sleep deprivation   . Seizures    Past Surgical History  Procedure Laterality Date  . Prostate surgery     No family history on file. History  Substance Use Topics  . Smoking status: Former Research scientist (life sciences)  . Smokeless tobacco: Not on file  . Alcohol Use: 3.6 oz/week     6 Cans of beer per week    Review of Systems  Constitutional: Negative for fever and chills.  HENT: Negative for congestion and facial swelling.   Eyes: Negative for discharge and visual disturbance.  Respiratory: Negative for shortness of breath.   Cardiovascular: Negative for chest pain and palpitations.  Gastrointestinal: Negative for vomiting, abdominal pain and diarrhea.  Musculoskeletal: Negative for arthralgias and myalgias.  Skin: Negative for color change and rash.  Neurological: Positive for seizures. Negative for tremors, syncope and headaches.  Psychiatric/Behavioral: Negative for confusion and dysphoric mood.    Allergies  Review of patient's allergies indicates no known allergies.  Home Medications   Current Outpatient Rx  Name  Route  Sig  Dispense  Refill  . cholecalciferol (VITAMIN D) 1000 UNITS tablet   Oral   Take 2,000 Units by mouth daily.         Marland Kitchen FLUoxetine (PROZAC) 40 MG capsule   Oral   Take 40 mg by mouth daily.         Marland Kitchen HYDROcodone-acetaminophen (NORCO/VICODIN) 5-325 MG per tablet   Oral   Take 1 tablet by mouth every 6 (six) hours as needed for pain.         . phenazopyridine (PYRIDIUM) 200 MG tablet   Oral   Take 1 tablet (200 mg total) by mouth 3 (three) times daily as needed for pain.   6 tablet   0   . traMADol (ULTRAM) 50 MG tablet   Oral   Take 1 tablet (50 mg total) by mouth every  6 (six) hours as needed for pain.   15 tablet   0   . traZODone (DESYREL) 150 MG tablet   Oral   Take 150 mg by mouth at bedtime.          BP 103/78  Pulse 85  Temp(Src) 97.7 F (36.5 C) (Oral)  Resp 14  SpO2 98% Physical Exam  Constitutional: He appears well-developed and well-nourished.  HENT:  Head: Normocephalic and atraumatic.  Eyes: EOM are normal. Pupils are equal, round, and reactive to light.  Neck: Normal range of motion. Neck supple. No JVD present.  Cardiovascular: Normal rate and regular rhythm.  Exam reveals no  gallop and no friction rub.   No murmur heard. Pulmonary/Chest: No respiratory distress. He has no wheezes.  Abdominal: He exhibits no distension. There is no rebound and no guarding.  Musculoskeletal: Normal range of motion.  Neurological: He is alert. He has normal strength. No sensory deficit. Coordination normal. GCS eye subscore is 4. GCS verbal subscore is 5. GCS motor subscore is 6. He displays no Babinski's sign on the right side. He displays no Babinski's sign on the left side.  Reflex Scores:      Tricep reflexes are 2+ on the right side and 2+ on the left side.      Bicep reflexes are 2+ on the right side and 2+ on the left side.      Brachioradialis reflexes are 2+ on the right side and 2+ on the left side.      Patellar reflexes are 2+ on the right side and 2+ on the left side.      Achilles reflexes are 2+ on the right side and 2+ on the left side. Patient alert but confused to place and situation. Mild left-sided facial droop  Skin: No rash noted. No pallor.  Psychiatric: He has a normal mood and affect. His behavior is normal.    ED Course  Procedures (including critical care time) Labs Review Labs Reviewed  COMPREHENSIVE METABOLIC PANEL - Abnormal; Notable for the following:    CO2 <7 (*)    Glucose, Bld 139 (*)    BUN 4 (*)    Total Protein 9.5 (*)    AST 39 (*)    Alkaline Phosphatase 124 (*)    GFR calc non Af Amer 76 (*)    GFR calc Af Amer 89 (*)    All other components within normal limits  CBC - Abnormal; Notable for the following:    WBC 13.0 (*)    MCV 102.8 (*)    MCH 36.1 (*)    All other components within normal limits  ETHANOL - Abnormal; Notable for the following:    Alcohol, Ethyl (B) 104 (*)    All other components within normal limits  LACTIC ACID, PLASMA - Abnormal; Notable for the following:    Lactic Acid, Venous 22.5 (*)    All other components within normal limits  GLUCOSE, CAPILLARY - Abnormal; Notable for the following:     Glucose-Capillary 135 (*)    All other components within normal limits  SALICYLATE LEVEL - Abnormal; Notable for the following:    Salicylate Lvl 123456 (*)    All other components within normal limits  CK - Abnormal; Notable for the following:    Total CK 279 (*)    All other components within normal limits  POCT I-STAT 3, BLOOD GAS (G3P V) - Abnormal; Notable for the following:    pH, Ven 6.871 (*)  pCO2, Ven 43.3 (*)    pO2, Ven 170.0 (*)    Bicarbonate 8.0 (*)    Acid-base deficit 26.0 (*)    All other components within normal limits  POCT I-STAT 3, BLOOD GAS (G3+) - Abnormal; Notable for the following:    pH, Arterial 7.154 (*)    pCO2 arterial 20.7 (*)    Bicarbonate 7.3 (*)    Acid-base deficit 20.0 (*)    All other components within normal limits  CULTURE, BLOOD (ROUTINE X 2)  CULTURE, BLOOD (ROUTINE X 2)  ACETAMINOPHEN LEVEL  URINE RAPID DRUG SCREEN (HOSP PERFORMED)  BLOOD GAS, VENOUS  BLOOD GAS, ARTERIAL   Imaging Review Ct Head Wo Contrast  01/09/2014   CLINICAL DATA:  A witnessed seizure  EXAM: CT HEAD WITHOUT CONTRAST  TECHNIQUE: Contiguous axial images were obtained from the base of the skull through the vertex without intravenous contrast.  COMPARISON:  None.  FINDINGS: No acute intracranial hemorrhage. No focal mass lesion. No CT evidence of acute infarction. No midline shift or mass effect. No hydrocephalus. Basilar cisterns are patent.  There is generalized cortical atrophy. There is periventricular and subcortical white matter hypodensities.  There is high-density thickening along the interhemispheric fissure which is felt to represent benign dural calcification and is symmetric from left-to-right.  Paranasal sinuses and  mastoid air cells are clear.  IMPRESSION: 1. No acute intracranial findings. 2. No evidence of acute trauma. 3. Atrophy and microvascular disease noted.   Electronically Signed   By: Suzy Bouchard M.D.   On: 01/09/2014 23:39   Dg Chest Port 1  View  01/10/2014   CLINICAL DATA:  Seizure  EXAM: PORTABLE CHEST - 1 VIEW  COMPARISON:  DG CHEST 1V PORT dated 04/09/2010  FINDINGS: Normal cardiac silhouette. No effusion, infiltrate, or pneumothorax. No acute osseous abnormality.  IMPRESSION: No acute cardiopulmonary process.   Electronically Signed   By: Suzy Bouchard M.D.   On: 01/10/2014 00:32    EKG Interpretation   None       MDM   1. Seizure   2. Status epilepticus      63 year old male in no known seizure history comes in with a chief complaint of seizures. Patient was friend earlier today and noted to have full body jerking with unknown loss of bowel or bladder. This lasted for about a minute patient was confused for about 2 minutes and then had another episode also lasted less than a minute.  EMS was called blood sugar was 120 taking here for evaluation.  On initial exam patient was alert but confused. Patient with no current complaints no noted pain. Patient denies knowledge of the situation. Patient with a benign initial neuro exam with the exception of mild left-sided facial droop.  Patient had seizure while in the ED started with some rapid nystagmus and then had a right-sided focality with myoclonus. Seizure lasted about 1 minute was given Ativan.   Birdena Crandall is Keppra chest x-ray CT head blood culture VBG with marked acidosis we'll check ABG. Check CBC CMP.   Patient with marked lactic acidosis. Continuing to have seizure-like activity while in the ED. Neurology consult. Negative head CT.  Neurology would like [place] to admit to medicine.  Contacting critical care due to status.   Will admit to critical care.   Deno Etienne, MD 01/10/14 [phone]

## 2014-01-09 NOTE — Progress Notes (Signed)
Critical ABG reported to Bobbie Stack, RRT.  pH: 7.154 Co2: 20.7 Po2: 100

## 2014-01-09 NOTE — ED Notes (Signed)
Pt currently post ictal. NRB applied during seizure. MD and resident at bedside.

## 2014-01-09 NOTE — ED Notes (Signed)
Per ems-- witnesses report pt had 2 short seizures. Upon ems arrival pt was post ictal- Responsive to stimuli but answering inappropriate, diaphoresis. Pt slightly combative with ems. Pt a&o at this time.

## 2014-01-09 NOTE — ED Notes (Addendum)
Placing seizure pads on bed- Pt had another seizure lasting approx 2 mins with deviation to R and lip smacking.

## 2014-01-10 DIAGNOSIS — G40401 Other generalized epilepsy and epileptic syndromes, not intractable, with status epilepticus: Secondary | ICD-10-CM

## 2014-01-10 DIAGNOSIS — R569 Unspecified convulsions: Secondary | ICD-10-CM | POA: Diagnosis present

## 2014-01-10 LAB — POCT I-STAT 3, ART BLOOD GAS (G3+)
Acid-base deficit: 7 mmol/L — ABNORMAL HIGH (ref 0.0–2.0)
Bicarbonate: 18.9 mEq/L — ABNORMAL LOW (ref 20.0–24.0)
O2 Saturation: 100 %
PCO2 ART: 39.2 mmHg (ref 35.0–45.0)
PH ART: 7.289 — AB (ref 7.350–7.450)
TCO2: 20 mmol/L (ref 0–100)
pO2, Arterial: 440 mmHg — ABNORMAL HIGH (ref 80.0–100.0)

## 2014-01-10 LAB — CBC
HCT: 37 % — ABNORMAL LOW (ref 39.0–52.0)
HEMOGLOBIN: 13.1 g/dL (ref 13.0–17.0)
MCH: 34.7 pg — AB (ref 26.0–34.0)
MCHC: 35.4 g/dL (ref 30.0–36.0)
MCV: 98.1 fL (ref 78.0–100.0)
Platelets: 174 10*3/uL (ref 150–400)
RBC: 3.77 MIL/uL — AB (ref 4.22–5.81)
RDW: 13.3 % (ref 11.5–15.5)
WBC: 14.1 10*3/uL — ABNORMAL HIGH (ref 4.0–10.5)

## 2014-01-10 LAB — CREATININE, SERUM
Creatinine, Ser: 0.84 mg/dL (ref 0.50–1.35)
GFR calc Af Amer: >90 mL/min (ref >90)

## 2014-01-10 LAB — GLUCOSE, CAPILLARY: GLUCOSE-CAPILLARY: 96 mg/dL (ref 70–99)

## 2014-01-10 LAB — ACETAMINOPHEN LEVEL

## 2014-01-10 LAB — CK: CK TOTAL: 279 U/L — AB (ref 7–232)

## 2014-01-10 LAB — MRSA PCR SCREENING: MRSA BY PCR: NEGATIVE

## 2014-01-10 LAB — SALICYLATE LEVEL: Salicylate Lvl: <2 mg/dL — ABNORMAL LOW (ref 2.8–20.0)

## 2014-01-10 LAB — LACTIC ACID, PLASMA: LACTIC ACID, VENOUS: 1.2 mmol/L (ref 0.5–2.2)

## 2014-01-10 MED ORDER — MIDAZOLAM HCL 2 MG/2ML IJ SOLN: 2.0000 mg | Freq: Once | INTRAMUSCULAR | Status: DC

## 2014-01-10 MED ORDER — SODIUM CHLORIDE 0.9 % IV SOLN
500.0000 mg | Freq: Two times a day (BID) | INTRAVENOUS | Status: DC
  Filled 2014-01-10: qty 5

## 2014-01-10 MED ORDER — SODIUM CHLORIDE 0.9 % IV SOLN
250.0000 mL | INTRAVENOUS | Status: DC | PRN
  Administered 2014-01-10: 250 mL via INTRAVENOUS

## 2014-01-10 MED ORDER — LORAZEPAM 2 MG/ML IJ SOLN
2.0000 mg | INTRAMUSCULAR | Status: DC | PRN
  Administered 2014-01-10: 2 mg via INTRAVENOUS
  Filled 2014-01-10: qty 1

## 2014-01-10 MED ORDER — THIAMINE HCL 100 MG/ML IJ SOLN
Freq: Once | INTRAVENOUS | Status: AC
  Administered 2014-01-10: 07:00:00 via INTRAVENOUS
  Filled 2014-01-10: qty 1000

## 2014-01-10 MED ORDER — VITAMIN B-1 100 MG PO TABS
100.0000 mg | ORAL_TABLET | Freq: Every day | ORAL | Status: DC
  Administered 2014-01-10 – 2014-01-12 (×3): 100 mg via ORAL
  Filled 2014-01-10 (×3): qty 1

## 2014-01-10 MED ORDER — FOLIC ACID 1 MG PO TABS
1.0000 mg | ORAL_TABLET | Freq: Every day | ORAL | Status: DC
  Administered 2014-01-10 – 2014-01-12 (×3): 1 mg via ORAL
  Filled 2014-01-10 (×3): qty 1

## 2014-01-10 MED ORDER — ADULT MULTIVITAMIN W/MINERALS CH
1.0000 | ORAL_TABLET | Freq: Every day | ORAL | Status: DC
  Administered 2014-01-10 – 2014-01-12 (×3): 1 via ORAL
  Filled 2014-01-10 (×3): qty 1

## 2014-01-10 MED ORDER — SODIUM CHLORIDE 0.9 % IV SOLN
500.0000 mg | Freq: Two times a day (BID) | INTRAVENOUS | Status: DC
  Administered 2014-01-10 – 2014-01-12 (×4): 500 mg via INTRAVENOUS
  Filled 2014-01-10 (×6): qty 5

## 2014-01-10 MED ORDER — SODIUM CHLORIDE 0.9 % IV SOLN
500.0000 mg | Freq: Once | INTRAVENOUS | Status: AC
  Administered 2014-01-10: 500 mg via INTRAVENOUS
  Filled 2014-01-10: qty 5

## 2014-01-10 MED ORDER — MIDAZOLAM HCL 2 MG/2ML IJ SOLN
2.0000 mg | Freq: Once | INTRAMUSCULAR | Status: DC
Start: 2014-01-10 — End: 2014-01-10

## 2014-01-10 MED ORDER — ENOXAPARIN SODIUM 40 MG/0.4ML ~~LOC~~ SOLN
40.0000 mg | SUBCUTANEOUS | Status: DC
  Administered 2014-01-10: 40 mg via SUBCUTANEOUS
  Filled 2014-01-10 (×2): qty 0.4

## 2014-01-10 NOTE — ED Provider Notes (Signed)
I saw and evaluated the patient, reviewed the resident's note and I agree with the findings and plan.  EKG Interpretation   None       Patient is a 63 year old male with a history of prostate cancer his been in remission for several years, alcohol abuse who presents to the emergency department after having 2 seizures at home. Patient's family reports that his girlfriend was at home with them and they were both drinking alcohol. They report that he had 2 tonic-clonic seizure and was post ictal afterwards. Unclear duration the patient seizures. Upon arrival to the emergency department, patient is confused but awake. He is mildly tachycardic but otherwise hemodynamically stable.  In the emergency department, patient began having lipsmacking and then right gaze deviation and then began having posturing of all 4 extremities. This lasted approximately 2-3 minutes. Patient became very hypertensive and tachycardic during this episode and bit his cardiac and was incontinent of urine. He was postictal afterwards and had decreased movement of his right upper extremity likely due to a Todd paralysis. His seizure stopped after he was given 1 mg of Ativan. Patient received another gram of IV Keppra in the emergency department.    Patient's labs showed a mixed metabolic and respiratory acidosis with a lactate greater than 22. Suspect his lactic acidosis is secondary to multiple seizures. Head CT shows no acute abnormality. He has no known history of head injury and no signs of trauma on exam. Family denies that he has been feeling poorly recently or had any infectious symptoms. He does have a mild leukocytosis but I suspect this is reactive. No prior history of alcohol withdrawal seizures. His alcohol the emergency department is 100. He does have a prior history of taking tramadol but family is not sure if he has been taking this medication. He is afebrile in the emergency department.   Discussed with Dr. Doy Mince  with neurology who have seen the patient and recommends medicine admission. Have discussed with critical care for admission.   While in the emergency department, patient has had another seizure and was given another milligram of Ativan with resolution of symptoms. Given he has never back to his baseline, concern for status epilepticus. He is currently improving and protecting his airway.    CRITICAL CARE Performed by: Nyra Jabs   Total critical care time: 45 minutes  Critical care time was exclusive of separately billable procedures and treating other patients.  Critical care was necessary to treat or prevent imminent or life-threatening deterioration.  Critical care was time spent personally by me on the following activities: development of treatment plan with patient and/or surrogate as well as nursing, discussions with consultants, evaluation of patient's response to treatment, examination of patient, obtaining history from patient or surrogate, ordering and performing treatments and interventions, ordering and review of laboratory studies, ordering and review of radiographic studies, pulse oximetry and re-evaluation of patient's condition.   [place], DO 01/10/14 [phone]

## 2014-01-10 NOTE — ED Notes (Signed)
Attempted to I&o cath pt for urine with no success.

## 2014-01-10 NOTE — Consult Note (Signed)
Reason for Consult:Seizures Referring Physician: Ward  CC: Seizures  HPI: Roger Savage is an 63 y.o. male who by report of friends has been on a drinking binge recently.  Today while with friends was noted to have 2 seizures.  EMS was called and the patient was transported to the ED.  He as post-ictal at that time.  IN the ED the patient was noted to have another seizure.  Seizure was described as beginning with lip smacking and nystagmus, patient then had head turning to the right and was noted to develop generalized tonic -clonic activity.   Per family the patient has no prior history of seizures.  Past Medical History  Diagnosis Date  . Prostate cancer   . Anxiety   . Depression   . Sleep deprivation   . Seizures     Past Surgical History  Procedure Laterality Date  . Prostate surgery      Family history: Patient unable to provide history.  Father alive and well.  Mother deceased but unclear what medical issues she had.  Has three children, all alive and well.  One daughter did have seizures as an infant but they were not present beyond the age of 2.    Social History:  reports that he has quit smoking. He does not have any smokeless tobacco history on file. He reports that he drinks about 3.6 ounces of alcohol per week. He reports that he does not use illicit drugs. Son reports that he continues to smoke.  No Known Allergies  Medications: I have reviewed the patient's current medications. Prior to Admission:  Current outpatient prescriptions: cholecalciferol (VITAMIN D) 1000 UNITS tablet, Take 2,000 Units by mouth daily., Disp: , Rfl: ;   FLUoxetine (PROZAC) 40 MG capsule, Take 40 mg by mouth daily., Disp: , Rfl: ;   HYDROcodone-acetaminophen (NORCO/VICODIN) 5-325 MG per tablet, Take 1 tablet by mouth every 6 (six) hours as needed for pain., Disp: , Rfl:  phenazopyridine (PYRIDIUM) 200 MG tablet, Take 1 tablet (200 mg total) by mouth 3 (three) times daily as needed for pain.,  Disp: 6 tablet, Rfl: traMADol (ULTRAM) 50 MG tablet, Take 1 tablet (50 mg total) by mouth every 6 (six) hours as needed for pain., Disp: 15 tablet, Rfl: 0;  traZODone (DESYREL) 150 MG tablet, Take 150 mg by mouth at bedtime., Disp: , Rfl:   ROS: Unable to provide due to mental status  Physical Examination: Blood pressure 103/78, pulse 85, temperature 97.7 F (36.5 C), temperature source Oral, resp. rate 14, SpO2 98.00%.  Neurologic Examination Mental Status: Patient does not respond to verbal stimuli.  Does not respond to deep sternal rub.  Does not follow commands.  No verbalizations noted.  Cranial Nerves: II: patient does not respond confrontation bilaterally, pupils right 4 mm, left 4 mm,and reactive bilaterally III,IV,VI: Extraocular movements intact bilaterally V,VII: corneal reflex present bilaterally, decreased right NLF  VIII: patient does not respond to verbal stimuli IX,X: gag reflex unable to be tested, XI: trapezius strength unable to test bilaterally XII: tongue strength unable to test Motor: Becomes agitated and moves all of his extremities but less movement noted in the RUE.   Sensory: Responds to noxious stimuli in all extremities. Deep Tendon Reflexes:  3+ throughout with 4+ ankle jerks bilaterally and sustained clonus Plantars: upgoing bilaterally Cerebellar: Unable to perform    Laboratory Studies:   Basic Metabolic Panel:  Recent Labs Lab 01/09/14 2252  NA 144  K 4.0  CL 96  CO2 <  7*  GLUCOSE 139*  BUN 4*  CREATININE 1.03  CALCIUM 10.0    Liver Function Tests:  Recent Labs Lab 01/09/14 2252  AST 39*  ALT 20  ALKPHOS 124*  BILITOT 0.4  PROT 9.5*  ALBUMIN 4.3   No results found for this basename: LIPASE, AMYLASE,  in the last 168 hours No results found for this basename: AMMONIA,  in the last 168 hours  CBC:  Recent Labs Lab 01/09/14 2252  WBC 13.0*  HGB 16.8  HCT 47.9  MCV 102.8*  PLT 238    Cardiac Enzymes:  Recent  Labs Lab 01/09/14 2341  CKTOTAL 279*    BNP: No components found with this basename: POCBNP,   CBG:  Recent Labs Lab 01/09/14 2242  GLUCAP 135*    Microbiology: Results for orders placed during the hospital encounter of 05/05/13  URINE CULTURE     Status: None   Collection Time    05/05/13  6:07 PM      Result Value Range Status   Specimen Description URINE, CLEAN CATCH   Final   Special Requests NONE   Final   Culture  Setup Time 05/05/2013 21:03   Final   Colony Count NO GROWTH   Final   Culture NO GROWTH   Final   Report Status 05/06/2013 FINAL   Final    Coagulation Studies: No results found for this basename: LABPROT, INR,  in the last 72 hours  Urinalysis: No results found for this basename: COLORURINE, APPERANCEUR, LABSPEC, PHURINE, GLUCOSEU, HGBUR, BILIRUBINUR, KETONESUR, PROTEINUR, UROBILINOGEN, NITRITE, LEUKOCYTESUR,  in the last 168 hours  Lipid Panel:  No results found for this basename: chol, trig, hdl, cholhdl, vldl, ldlcalc    HgbA1C:  No results found for this basename: HGBA1C    Urine Drug Screen:     Component Value Date/Time   LABOPIA NONE DETECTED 08/02/2010 2359   COCAINSCRNUR NONE DETECTED 08/02/2010 2359   LABBENZ NONE DETECTED 08/02/2010 2359   AMPHETMU NONE DETECTED 08/02/2010 2359   THCU NONE DETECTED 08/02/2010 2359   LABBARB  Value: NONE DETECTED        DRUG SCREEN FOR MEDICAL PURPOSES ONLY.  IF CONFIRMATION IS NEEDED FOR ANY PURPOSE, NOTIFY LAB WITHIN 5 DAYS.        LOWEST DETECTABLE LIMITS FOR URINE DRUG SCREEN Drug Class       Cutoff (ng/mL) Amphetamine      1000 Barbiturate      200 Benzodiazepine   010 Tricyclics       932 Opiates          300 Cocaine          300 THC              50 08/02/2010 2359    Alcohol Level:  Recent Labs Lab 01/09/14 2252  ETH 104*    Imaging: Ct Head Wo Contrast  01/09/2014   CLINICAL DATA:  A witnessed seizure  EXAM: CT HEAD WITHOUT CONTRAST  TECHNIQUE: Contiguous axial images were obtained from the  base of the skull through the vertex without intravenous contrast.  COMPARISON:  None.  FINDINGS: No acute intracranial hemorrhage. No focal mass lesion. No CT evidence of acute infarction. No midline shift or mass effect. No hydrocephalus. Basilar cisterns are patent.  There is generalized cortical atrophy. There is periventricular and subcortical white matter hypodensities.  There is high-density thickening along the interhemispheric fissure which is felt to represent benign dural calcification and is symmetric from left-to-right.  Paranasal sinuses and  mastoid air cells are clear.  IMPRESSION: 1. No acute intracranial findings. 2. No evidence of acute trauma. 3. Atrophy and microvascular disease noted.   Electronically Signed   By: Suzy Bouchard M.D.   On: 01/09/2014 23:39   Dg Chest Port 1 View  01/10/2014   CLINICAL DATA:  Seizure  EXAM: PORTABLE CHEST - 1 VIEW  COMPARISON:  DG CHEST 1V PORT dated 04/09/2010  FINDINGS: Normal cardiac silhouette. No effusion, infiltrate, or pneumothorax. No acute osseous abnormality.  IMPRESSION: No acute cardiopulmonary process.   Electronically Signed   By: Suzy Bouchard M.D.   On: 01/10/2014 00:32     Assessment/Plan: 63 year old male presenting with new onset seizures.  The patient per report has been on a drinking binge.  Current ETOH level of 104. Patient does seem to have some mild focal right sided findings on examination and seizure activity description suggests a left brain source as well.  Head CT reviewed and shows no acute changes.  There are also no chronic cortically based changes to suggest etiology.  Further work up recommended.    Recommendations: 1.  MRI of the brain with and without contrast 2. EEG 3. Seizure precautions 4. Patient loaded with Keppra 1000mg .  Patient continues to have some tremor.  Unclear significance.  Will give another 500mg  IV 5.  Maintenance Keppra at 500mg  IV q12hours  Case discussed with Dr. Burney Gauze, MD [facility] [phone] 01/10/2014, 1:01 AM

## 2014-01-10 NOTE — ED Notes (Signed)
Pt sleeping   Family at bedside. 

## 2014-01-10 NOTE — ED Notes (Signed)
Pt has received 3L of NS.

## 2014-01-10 NOTE — ED Notes (Signed)
I and Out cath not able to complete Pt did not tolerate cath could not advance cath

## 2014-01-10 NOTE — ED Notes (Signed)
Critical care at bedside  

## 2014-01-10 NOTE — Progress Notes (Signed)
01/10/14 2020  PM shift RN saw that MRI order pending. AM shift RN stated that MRI wasn't available for pt today. MRI was not available at night either per Radiology and multiple attempts to call MRI. Charge RN Gerald Stabs made aware. CCM eLink MD made aware. Pt and pt family made aware.  Wyn Quaker RN

## 2014-01-10 NOTE — Progress Notes (Signed)
PULMONARY  / CRITICAL CARE MEDICINE HISTORY AND PHYSICAL EXAMINATION  Name: Roger Savage MRN: [id number] DOB: 1951-11-01    ADMISSION DATE:  01-29-14  CHIEF COMPLAINT:  Seizures  BRIEF PATIENT DESCRIPTION: 63 yo male with h/o prostate cancer, alcohol abuse, substance abuse presenting with multiple seizures and remains encephalopathic.  SIGNIFICANT EVENTS / STUDIES:  1. CT Head wo contrast 29-Jan-2014 - no acute intracranial findings, no evidence of acute trauma, atrophy and microvascular disease noted  LINES / TUBES: 1. PIV  CULTURES: 1. Blood cultures x 2 01-29-2014>>>  ANTIBIOTICS: 1. None  HISTORY OF PRESENT ILLNESS:   63 yo male with h/o prostate cancer, alcohol abuse and substance abuse who was transported to ED by EMS for seizures. Per report, he has been binge drinking lately and was with friends who noted that he had 2 seizures that resolved spontaneously and was found to be post-ictal afterward. He has no known history of seizures in the past. In the ED, he did have one seizure with lip smacking and nystagmus, which became a generalized tonic clonic seizure for which he received a dose of Ativan. He subsequently again showed signs of lip smacking and got a second dose of Ativan. He remains encephalopathic and has not returned to his previous baseline.  His daughter reports that he does have a known substance abuse history and as far as she knows, he has been in his usual state of health with no recent complaints of fever/chills, pain, shortness of breath or headache.   VITAL SIGNS: Temp:  [97.7 F (36.5 C)-98.5 F (36.9 C)] 98.5 F (36.9 C) (02/01 0830) Pulse Rate:  [73-119] 73 (02/01 0800) Resp:  [8-29] 20 (02/01 0555) BP: (97-167)/(59-108) 135/70 mmHg (02/01 0800) SpO2:  [95 %-100 %] 96 % (02/01 0800)  HEMODYNAMICS:    VENTILATOR SETTINGS:    INTAKE / OUTPUT: Intake/Output   None     PHYSICAL EXAMINATION: General:  Appearing stated age, no acute  distress Neuro:  Awake and follows commands, aware that he is incontinent of urine HEENT:  AT, [place], PERRL,  Neck:  supple Cardiovascular:  RRR, no murmurs/rubs/gallops, no edema Lungs:  CTAB, no wheezes/rales/rhonchi Abdomen:  +BS, soft, NT, ND Musculoskeletal:  No clubbing or cyanosis Skin:  No rash  LABS:  CBC Recent Labs     01/29/14  2252  01/10/14  0338  WBC  13.0*  14.1*  HGB  16.8  13.1  HCT  47.9  37.0*  PLT  238  174    Coag's No results found for this basename: APTT, INR,  in the last 72 hours  BMET Recent Labs     January 29, 2014  2252  01/10/14  0338  NA  144   --   K  4.0   --   CL  96   --   CO2  <7*   --   BUN  4*   --   CREATININE  1.03  0.84  GLUCOSE  139*   --     Electrolytes Recent Labs     2014/01/29  2252  CALCIUM  10.0    Sepsis Markers No results found for this basename: LACTICACIDVEN, PROCALCITON, O2SATVEN,  in the last 72 hours  ABG Recent Labs     01/29/14  2329  01/10/14  0143  PHART  7.154*  7.289*  PCO2ART  20.7*  39.2  PO2ART  100.0  440.0*    Liver Enzymes Recent Labs     2014-01-29  2252  AST  39*  ALT  20  ALKPHOS  124*  BILITOT  0.4  ALBUMIN  4.3    Cardiac Enzymes No results found for this basename: TROPONINI, PROBNP,  in the last 72 hours  Glucose Recent Labs     01/09/14  2242  01/10/14  0621  GLUCAP  135*  96    Imaging Ct Head Wo Contrast  01/09/2014   CLINICAL DATA:  A witnessed seizure  EXAM: CT HEAD WITHOUT CONTRAST  TECHNIQUE: Contiguous axial images were obtained from the base of the skull through the vertex without intravenous contrast.  COMPARISON:  None.  FINDINGS: No acute intracranial hemorrhage. No focal mass lesion. No CT evidence of acute infarction. No midline shift or mass effect. No hydrocephalus. Basilar cisterns are patent.  There is generalized cortical atrophy. There is periventricular and subcortical white matter hypodensities.  There is high-density thickening along the  interhemispheric fissure which is felt to represent benign dural calcification and is symmetric from left-to-right.  Paranasal sinuses and  mastoid air cells are clear.  IMPRESSION: 1. No acute intracranial findings. 2. No evidence of acute trauma. 3. Atrophy and microvascular disease noted.   Electronically Signed   By: Suzy Bouchard M.D.   On: 01/09/2014 23:39   Dg Chest Port 1 View  01/10/2014   CLINICAL DATA:  Seizure  EXAM: PORTABLE CHEST - 1 VIEW  COMPARISON:  DG CHEST 1V PORT dated 04/09/2010  FINDINGS: Normal cardiac silhouette. No effusion, infiltrate, or pneumothorax. No acute osseous abnormality.  IMPRESSION: No acute cardiopulmonary process.   Electronically Signed   By: Suzy Bouchard M.D.   On: 01/10/2014 00:32     ASSESSMENT / PLAN: 63 yo male with h/o substance abuse, no previous seizure history, presenting to ED with new onset seizures with concern for alcohol withdrawal.  Active Problems:   Seizure   PULMONARY A/P: 1. No acute issue, continue supplemental oxygen as needed  CARDIOVASCULAR A/P:  1. No acute issues, continue to monitor closely  RENAL A/P:  1. Metabolic acidosis, due to elevated lactate on arrival which was likely related to seizure activity.  ABG showing improvement in acidosis  Check repeat lactate in AM to ensure downtrending 2. H/o prostate cancer, monitor urine output closely, consider Foley if no urine output due to possibility of urinary retention   GASTROINTESTINAL A/P:  1. No acute issue  HEMATOLOGIC A/P:   1. Mild leukocytosis: likely related to recent seizure activity  Continue to monitor CBC closely  INFECTIOUS A/P: 1. No acute issue, blood cultures drawn on presentation are pending  ENDOCRINE A/P: 1. No acute issue  NEUROLOGIC A/P: 1. Seizure: new onset seizure of unclear etiology but with concern for alcohol withdrawal in setting of known alcohol abuse and recent report of binge drinking. Also concern for other  substance use given prior history per family report.  Neurology consulted and appreciate help - MRI Brain w/wo contrast, EEG monitoring  S/p Keppra load in ED, continue Keppra 500 mg IV q12 for now  Seizure precautions  CIWA protocol for alcohol withdrawal 2. Encephalopathy: possibly related to post-ictal state, alcohol use, possible substance abuse and recent doses of Ativan  Continue to monitor mental status closely  Avoid sedating medications, currently holding home psych medications  UDS pending 3.   Alcohol abuse  CIWA protocol  Banana bag x 1, MVI, folic acid and thiamine  Global: Consider move out of ICU and transfer to [facility] in am.  Richardson Landry Minor ACNP Maryanna Shape  PCCM Pager [phone] till 3 pm If no answer page [phone] 01/10/2014, 10:33 AM  Will transfer to SDU and to [facility] service for AM.  PCCM will sign off, please call back if needed.  Patient seen and examined, agree with above note.  I dictated the care and orders written for this patient under my direction.  Rush Farmer, MD [phone]

## 2014-01-10 NOTE — ED Notes (Signed)
Family reminded that less stimulation is best for pt. Family verbalized understanding. Family given drinks

## 2014-01-10 NOTE — ED Notes (Signed)
Pt taken off NRB. Placed on [place] 2L.

## 2014-01-10 NOTE — ED Notes (Signed)
Attempted to call report

## 2014-01-10 NOTE — H&P (Signed)
PULMONARY  / CRITICAL CARE MEDICINE HISTORY AND PHYSICAL EXAMINATION  Name: Roger Savage MRN: [id number] DOB: 18-Apr-1951    ADMISSION DATE:  19-Jan-2014  CHIEF COMPLAINT:  Seizures  BRIEF PATIENT DESCRIPTION: 63 yo male with h/o prostate cancer, alcohol abuse, substance abuse presenting with multiple seizures and remains encephalopathic.  SIGNIFICANT EVENTS / STUDIES:  1. CT Head wo contrast 01-19-14 - no acute intracranial findings, no evidence of acute trauma, atrophy and microvascular disease noted  LINES / TUBES: 1. PIV  CULTURES: 1. Blood cultures x 2 01/19/2014>>>  ANTIBIOTICS: 1. None  HISTORY OF PRESENT ILLNESS:   63 yo male with h/o prostate cancer, alcohol abuse and substance abuse who was transported to ED by EMS for seizures. Per report, he has been binge drinking lately and was with friends who noted that he had 2 seizures that resolved spontaneously and was found to be post-ictal afterward. He has no known history of seizures in the past. In the ED, he did have one seizure with lip smacking and nystagmus, which became a generalized tonic clonic seizure for which he received a dose of Ativan. He subsequently again showed signs of lip smacking and got a second dose of Ativan. He remains encephalopathic and has not returned to his previous baseline.  His daughter reports that he does have a known substance abuse history and as far as she knows, he has been in his usual state of health with no recent complaints of fever/chills, pain, shortness of breath or headache.   PAST MEDICAL HISTORY :  Past Medical History  Diagnosis Date  . Prostate cancer   . Anxiety   . Depression   . Sleep deprivation   . Seizures     Past Surgical History  Procedure Laterality Date  . Prostate surgery      Prior to Admission medications   Medication Sig Start Date End Date Taking? Authorizing Provider  cholecalciferol (VITAMIN D) 1000 UNITS tablet Take 2,000 Units by mouth daily.     Historical Provider, MD  FLUoxetine (PROZAC) 40 MG capsule Take 40 mg by mouth daily.    Historical Provider, MD  HYDROcodone-acetaminophen (NORCO/VICODIN) 5-325 MG per tablet Take 1 tablet by mouth every 6 (six) hours as needed for pain.    Historical Provider, MD  phenazopyridine (PYRIDIUM) 200 MG tablet Take 1 tablet (200 mg total) by mouth 3 (three) times daily as needed for pain. 05/05/13   Julianne Rice, MD  traMADol (ULTRAM) 50 MG tablet Take 1 tablet (50 mg total) by mouth every 6 (six) hours as needed for pain. 05/05/13   Julianne Rice, MD  traZODone (DESYREL) 150 MG tablet Take 150 mg by mouth at bedtime.    Historical Provider, MD    No Known Allergies  FAMILY HISTORY:  Per daughter, father alive and well in his 74s, otherwise unable to obtain.  SOCIAL HISTORY: per medical record  reports that he has quit smoking. He does not have any smokeless tobacco history on file. He reports that he drinks about 3.6 ounces of alcohol per week. He reports that he does not use illicit drugs.  REVIEW OF SYSTEMS:  Unable to obtain secondary to patient's clinical status  PHYSICAL EXAM  VITAL SIGNS: Temp:  [97.7 F (36.5 C)] 97.7 F (36.5 C) 01-19-23 2222) Pulse Rate:  [76-119] 76 (02/01 0245) Resp:  [12-28] 27 (02/01 0245) BP: (97-167)/(73-108) 128/83 mmHg (02/01 0245) SpO2:  [95 %-100 %] 100 % (02/01 0245)  HEMODYNAMICS:    VENTILATOR SETTINGS:  INTAKE / OUTPUT: Intake/Output   None     PHYSICAL EXAMINATION: General:  Appearing stated age, no acute distress Neuro:  Briefly opens eyes to voice, able to move all extremities HEENT:  AT, [place], PERRL, EOMI, mmm, NRB in place Neck:  supple Cardiovascular:  RRR, no murmurs/rubs/gallops, no edema Lungs:  CTAB, no wheezes/rales/rhonchi Abdomen:  +BS, soft, NT, ND Musculoskeletal:  No clubbing or cyanosis Skin:  No rash  LABS:  CBC Recent Labs     01/09/14  2252  WBC  13.0*  HGB  16.8  HCT  47.9  PLT  238    Coag's No  results found for this basename: APTT, INR,  in the last 72 hours  BMET Recent Labs     01/09/14  2252  NA  144  K  4.0  CL  96  CO2  <7*  BUN  4*  CREATININE  1.03  GLUCOSE  139*    Electrolytes Recent Labs     01/09/14  2252  CALCIUM  10.0    Sepsis Markers No results found for this basename: LACTICACIDVEN, PROCALCITON, O2SATVEN,  in the last 72 hours  ABG Recent Labs     01/09/14  2329  01/10/14  0143  PHART  7.154*  7.289*  PCO2ART  20.7*  39.2  PO2ART  100.0  440.0*    Liver Enzymes Recent Labs     01/09/14  2252  AST  39*  ALT  20  ALKPHOS  124*  BILITOT  0.4  ALBUMIN  4.3    Cardiac Enzymes No results found for this basename: TROPONINI, PROBNP,  in the last 72 hours  Glucose Recent Labs     01/09/14  2242  GLUCAP  135*    Imaging Ct Head Wo Contrast  01/09/2014   CLINICAL DATA:  A witnessed seizure  EXAM: CT HEAD WITHOUT CONTRAST  TECHNIQUE: Contiguous axial images were obtained from the base of the skull through the vertex without intravenous contrast.  COMPARISON:  None.  FINDINGS: No acute intracranial hemorrhage. No focal mass lesion. No CT evidence of acute infarction. No midline shift or mass effect. No hydrocephalus. Basilar cisterns are patent.  There is generalized cortical atrophy. There is periventricular and subcortical white matter hypodensities.  There is high-density thickening along the interhemispheric fissure which is felt to represent benign dural calcification and is symmetric from left-to-right.  Paranasal sinuses and  mastoid air cells are clear.  IMPRESSION: 1. No acute intracranial findings. 2. No evidence of acute trauma. 3. Atrophy and microvascular disease noted.   Electronically Signed   By: Suzy Bouchard M.D.   On: 01/09/2014 23:39   Dg Chest Port 1 View  01/10/2014   CLINICAL DATA:  Seizure  EXAM: PORTABLE CHEST - 1 VIEW  COMPARISON:  DG CHEST 1V PORT dated 04/09/2010  FINDINGS: Normal cardiac silhouette. No effusion,  infiltrate, or pneumothorax. No acute osseous abnormality.  IMPRESSION: No acute cardiopulmonary process.   Electronically Signed   By: Suzy Bouchard M.D.   On: 01/10/2014 00:32     ASSESSMENT / PLAN: 63 yo male with h/o substance abuse, no previous seizure history, presenting to ED with new onset seizures with concern for alcohol withdrawal.  Active Problems:   Seizure   PULMONARY A/P: 1. No acute issue, continue supplemental oxygen as needed  CARDIOVASCULAR A/P:  1. No acute issues, continue to monitor closely  RENAL A/P:  1. Metabolic acidosis, due to elevated lactate on arrival which was likely  related to seizure activity.  ABG showing improvement in acidosis  Check repeat lactate in AM to ensure downtrending 2. H/o prostate cancer, monitor urine output closely, consider Foley if no urine output due to possibility of urinary retention   GASTROINTESTINAL A/P:  1. No acute issue  HEMATOLOGIC A/P:   1. Mild leukocytosis: likely related to recent seizure activity  Continue to monitor CBC closely  INFECTIOUS A/P: 1. No acute issue, blood cultures drawn on presentation are pending  ENDOCRINE A/P: 1. No acute issue  NEUROLOGIC A/P: 1. Seizure: new onset seizure of unclear etiology but with concern for alcohol withdrawal in setting of known alcohol abuse and recent report of binge drinking. Also concern for other substance use given prior history per family report.  Neurology consulted and appreciate help - MRI Brain w/wo contrast, EEG monitoring  S/p Keppra load in ED, continue Keppra 500 mg IV q12 for nwo  Seizure precautions  CIWA protocol for alcohol withdrawal 2. Encephalopathy: possibly related to post-ictal state, alcohol use, possible substance abuse and recent doses of Ativan  Continue to monitor mental status closely  Avoid sedating medications, currently holding home psych medications  UDS pending 3.   Alcohol abuse  CIWA protocol  Banana  bag x 1, MVI, folic acid and thiamine   BEST PRACTICE / DISPOSITION Level of Care:  ICU Consultants:  Neurology Code Status:  Full Diet:  NPO for now until mental status improves DVT Px:  lovenox GI Px:  none Skin Integrity:  intact Social / Family:  Patient's daughter updated at bedside.   I have personally obtained a history, examined the patient, evaluated laboratory and imaging results, formulated the assessment and plan and placed orders.  CRITICAL CARE: The patient is critically ill with multiple organ systems failure and requires high complexity decision making for assessment and support, frequent evaluation and titration of therapies, application of advanced monitoring technologies and extensive interpretation of multiple databases. Critical Care Time devoted to patient care services described in this note is 60 minutes.   Maricela Curet, MD Pulmonary and [place] Pager: [phone]  01/10/2014, 3:03 AM

## 2014-01-11 ENCOUNTER — Inpatient Hospital Stay (HOSPITAL_COMMUNITY): Payer: Non-veteran care

## 2014-01-11 LAB — CBC
HCT: 33.4 % — ABNORMAL LOW (ref 39.0–52.0)
Hemoglobin: 11.6 g/dL — ABNORMAL LOW (ref 13.0–17.0)
MCH: 34.6 pg — ABNORMAL HIGH (ref 26.0–34.0)
MCHC: 34.7 g/dL (ref 30.0–36.0)
MCV: 99.7 fL (ref 78.0–100.0)
Platelets: 159 10*3/uL (ref 150–400)
RBC: 3.35 MIL/uL — ABNORMAL LOW (ref 4.22–5.81)
RDW: 13.1 % (ref 11.5–15.5)
WBC: 8.7 10*3/uL (ref 4.0–10.5)

## 2014-01-11 LAB — BASIC METABOLIC PANEL
BUN: 7 mg/dL (ref 6–23)
CO2: 20 mEq/L (ref 19–32)
Calcium: 8 mg/dL — ABNORMAL LOW (ref 8.4–10.5)
Chloride: 107 mEq/L (ref 96–112)
Creatinine, Ser: 0.83 mg/dL (ref 0.50–1.35)
Glucose, Bld: 132 mg/dL — ABNORMAL HIGH (ref 70–99)
POTASSIUM: 3.3 meq/L — AB (ref 3.7–5.3)
Sodium: 141 mEq/L (ref 137–147)

## 2014-01-11 LAB — MAGNESIUM: MAGNESIUM: 2.1 mg/dL (ref 1.5–2.5)

## 2014-01-11 LAB — PHOSPHORUS: Phosphorus: 1.7 mg/dL — ABNORMAL LOW (ref 2.3–4.6)

## 2014-01-11 MED ORDER — K PHOS MONO-SOD PHOS DI & MONO 155-852-130 MG PO TABS
500.0000 mg | ORAL_TABLET | Freq: Two times a day (BID) | ORAL | Status: DC
  Administered 2014-01-11 – 2014-01-12 (×2): 500 mg via ORAL
  Filled 2014-01-11 (×3): qty 2

## 2014-01-11 MED ORDER — LOPERAMIDE HCL 2 MG PO CAPS
2.0000 mg | ORAL_CAPSULE | ORAL | Status: DC | PRN
  Filled 2014-01-11: qty 1

## 2014-01-11 MED ORDER — POTASSIUM CHLORIDE 20 MEQ/15ML (10%) PO LIQD
40.0000 meq | Freq: Once | ORAL | Status: AC
  Administered 2014-01-11: 40 meq
  Filled 2014-01-11: qty 30

## 2014-01-11 MED ORDER — ENOXAPARIN SODIUM 40 MG/0.4ML ~~LOC~~ SOLN
40.0000 mg | SUBCUTANEOUS | Status: DC
  Administered 2014-01-11: 40 mg via SUBCUTANEOUS
  Filled 2014-01-11 (×2): qty 0.4

## 2014-01-11 MED ORDER — LORAZEPAM 1 MG PO TABS
1.0000 mg | ORAL_TABLET | Freq: Two times a day (BID) | ORAL | Status: DC
  Administered 2014-01-11 – 2014-01-12 (×2): 1 mg via ORAL
  Filled 2014-01-11 (×2): qty 1

## 2014-01-11 NOTE — Progress Notes (Signed)
[facility] TEAM 1 - Stepdown/ICU TEAM Progress Note  Roger Savage [id number] DOB: 01/21/1951 DOA: 01/09/2014 PCP: No PCP Per Patient  Admit HPI / Brief Narrative: 63 yo male with h/o prostate cancer, alcohol abuse and substance abuse who was transported to ED by EMS for seizures. Per report, he had been binge drinking and was with friends who noted that he had 2 seizures that resolved spontaneously and was found to be post-ictal afterward. He has no known history of seizures in the past. In the ED, he did have one seizure with lip smacking and nystagmus, which became a generalized tonic clonic seizure for which he received a dose of Ativan. He subsequently again showed signs of lip smacking and got a second dose of Ativan. He remained encephalopathic and had not returned to his previous baseline.  His daughter reported that he does have a known substance abuse history.  SIGNIFICANT EVENTS / STUDIES:  1/31 - CT Head wo contrast - no acute intracranial findings, no evidence of acute trauma, atrophy and microvascular disease noted  HPI/Subjective: Pt is alert, but slightly confused.  Can tell me where he is, but states year is 2014 and doesn't know the month.    Assessment/Plan:  Seizure new onset seizure of unclear etiology but with concern for alcohol withdrawal - Keppra loaded in ED and dosing continues  Encephalopathy Slowly clearing - post-ictal but also possible contribution from substance withdrawal  Polysubstance abuse including EtOH Add low dose scheduled benzo on top of CIWA and montir mental status  Lactic acidosis  Resolved   Hypokalemia Mg is normal - replace and follow   Hypophosphatemia likely due to poor nutrition due to EtOH, as well as some GI losses w/ diarrhea - replace and follow   Hx of prostate CA  Macrocytic anemia Check B12 and folate  Code Status: FULL Family Communication: no family present at time of exam Disposition Plan: transfer to neuro  bed   Consultants: Neurology   Antibiotics: none  DVT prophylaxis: lovenox  Objective: Blood pressure 110/70, pulse 59, temperature 98.3 F (36.8 C), temperature source Oral, resp. rate 20, height 5' 6.93" (1.7 m), weight 62.6 kg (138 lb 0.1 oz), SpO2 100.00%.  Intake/Output Summary (Last 24 hours) at 01/11/14 1204 Last data filed at 01/11/14 1147  Gross per 24 hour  Intake 756.67 ml  Output    275 ml  Net 481.67 ml   Exam: General: No acute respiratory distress Lungs: Clear to auscultation bilaterally without wheezes or crackles Cardiovascular: Regular rate and rhythm without murmur gallop or rub normal S1 and S2 Abdomen: Nontender, nondistended, soft, bowel sounds positive, no rebound, no ascites, no appreciable mass Extremities: No significant cyanosis, clubbing, or edema bilateral lower extremities  Data Reviewed: Basic Metabolic Panel:  Recent Labs Lab 01/09/14 2252 01/10/14 0338 01/11/14 0254  NA 144  --  141  K 4.0  --  3.3*  CL 96  --  107  CO2 <7*  --  20  GLUCOSE 139*  --  132*  BUN 4*  --  7  CREATININE 1.03 0.84 0.83  CALCIUM 10.0  --  8.0*  MG  --   --  2.1  PHOS  --   --  1.7*   Liver Function Tests:  Recent Labs Lab 01/09/14 2252  AST 39*  ALT 20  ALKPHOS 124*  BILITOT 0.4  PROT 9.5*  ALBUMIN 4.3   No results found for this basename: AMMONIA,  in the last 168 hours  CBC:  Recent Labs Lab 01/09/14 2252 01/10/14 0338 01/11/14 0254  WBC 13.0* 14.1* 8.7  HGB 16.8 13.1 11.6*  HCT 47.9 37.0* 33.4*  MCV 102.8* 98.1 99.7  PLT 238 174 159   Cardiac Enzymes:  Recent Labs Lab 01/09/14 2341  CKTOTAL 279*   CBG:  Recent Labs Lab 01/09/14 2242 01/10/14 0621  GLUCAP 135* 96    Recent Results (from the past 240 hour(s))  CULTURE, BLOOD (ROUTINE X 2)     Status: None   Collection Time    01/09/14 11:20 PM      Result Value Range Status   Specimen Description BLOOD RIGHT ARM   Final   Special Requests BOTTLES DRAWN AEROBIC  AND ANAEROBIC 10CC EA   Final   Culture  Setup Time     Final   Value: 01/10/2014 05:35     Performed at [facility]   Culture     Final   Value:        BLOOD CULTURE RECEIVED NO GROWTH TO DATE CULTURE WILL BE HELD FOR 5 DAYS BEFORE ISSUING A FINAL NEGATIVE REPORT     Performed at [facility]   Report Status PENDING   Incomplete  CULTURE, BLOOD (ROUTINE X 2)     Status: None   Collection Time    01/09/14 11:30 PM      Result Value Range Status   Specimen Description BLOOD RIGHT HAND   Final   Special Requests BOTTLES DRAWN AEROBIC ONLY 10CC   Final   Culture  Setup Time     Final   Value: 01/10/2014 05:35     Performed at [facility]   Culture     Final   Value:        BLOOD CULTURE RECEIVED NO GROWTH TO DATE CULTURE WILL BE HELD FOR 5 DAYS BEFORE ISSUING A FINAL NEGATIVE REPORT     Performed at [facility]   Report Status PENDING   Incomplete  MRSA PCR SCREENING     Status: None   Collection Time    01/10/14  6:15 AM      Result Value Range Status   MRSA by PCR NEGATIVE  NEGATIVE Final   Comment:            The GeneXpert MRSA Assay (FDA     approved for NASAL specimens     only), is one component of a     comprehensive MRSA colonization     surveillance program. It is not     intended to diagnose MRSA     infection nor to guide or     monitor treatment for     MRSA infections.     Studies:  Recent x-ray studies have been reviewed in detail by the Attending Physician  Scheduled Meds:  Scheduled Meds: . enoxaparin (LOVENOX) injection  40 mg Subcutaneous Q24H  . folic acid  1 mg Oral Daily  . levETIRAcetam  500 mg Intravenous Q12H  . midazolam  2 mg Intravenous Once  . multivitamin with minerals  1 tablet Oral Daily  . thiamine  100 mg Oral Daily    Time spent on care of this patient: 35 mins   [place]  [phone] Pager - Text Page per Shea Evans as per below:  On-Call/Text Page:       [email or web address]      password [email or web address]  If 7PM-7AM, please contact night-coverage [email or web address] Password [email or web address] 01/11/2014, 12:04  PM   LOS: 2 days

## 2014-01-11 NOTE — Progress Notes (Signed)
Routine EEG completed at bedside. 

## 2014-01-11 NOTE — Progress Notes (Signed)
[facility] Progress Note Patient Name: Roger Savage DOB: [birth year] MRN: [id number]  Date of Service  01/11/2014   HPI/Events of Note     eICU Interventions  Hypokalemia -repleted    Intervention Category Minor Interventions: Electrolytes abnormality - evaluation and management  Roger Savage V. 01/11/2014, 4:41 AM

## 2014-01-12 DIAGNOSIS — F1010 Alcohol abuse, uncomplicated: Secondary | ICD-10-CM

## 2014-01-12 LAB — COMPREHENSIVE METABOLIC PANEL
ALT: 15 U/L (ref 0–53)
AST: 31 U/L (ref 0–37)
Albumin: 3.5 g/dL (ref 3.5–5.2)
Alkaline Phosphatase: 132 U/L — ABNORMAL HIGH (ref 39–117)
BUN: 5 mg/dL — ABNORMAL LOW (ref 6–23)
CALCIUM: 9.6 mg/dL (ref 8.4–10.5)
CO2: 23 mEq/L (ref 19–32)
CREATININE: 0.92 mg/dL (ref 0.50–1.35)
Chloride: 105 mEq/L (ref 96–112)
GFR calc Af Amer: >90 mL/min (ref >90)
GFR, EST NON AFRICAN AMERICAN: 89 mL/min — AB (ref >90)
Glucose, Bld: 95 mg/dL (ref 70–99)
Potassium: 4.2 mEq/L (ref 3.7–5.3)
SODIUM: 144 meq/L (ref 137–147)
TOTAL PROTEIN: 8 g/dL (ref 6.0–8.3)
Total Bilirubin: 0.5 mg/dL (ref 0.3–1.2)

## 2014-01-12 LAB — PROTIME-INR
INR: 0.89 (ref 0.00–1.49)
Prothrombin Time: 11.9 seconds (ref 11.6–15.2)

## 2014-01-12 LAB — PHOSPHORUS: Phosphorus: 2.9 mg/dL (ref 2.3–4.6)

## 2014-01-12 LAB — FOLATE: Folate: >20 ng/mL

## 2014-01-12 LAB — VITAMIN B12: VITAMIN B 12: 863 pg/mL (ref 211–911)

## 2014-01-12 MED ORDER — PAROXETINE HCL 20 MG PO TABS
40.0000 mg | ORAL_TABLET | Freq: Every morning | ORAL | Status: DC
  Administered 2014-01-12: 40 mg via ORAL
  Filled 2014-01-12: qty 2

## 2014-01-12 MED ORDER — TRAZODONE HCL 150 MG PO TABS
300.0000 mg | ORAL_TABLET | Freq: Every evening | ORAL | Status: DC | PRN
  Filled 2014-01-12: qty 2

## 2014-01-12 MED ORDER — LEVETIRACETAM 500 MG PO TABS
500.0000 mg | ORAL_TABLET | Freq: Two times a day (BID) | ORAL | Status: DC
  Filled 2014-01-12 (×2): qty 1

## 2014-01-12 NOTE — Progress Notes (Signed)
NEURO HOSPITALIST PROGRESS NOTE   SUBJECTIVE:                                                                                                                        No further seizures noted. Started on keppra 500 mg BID. Offers no neurological complains and said that he is anxious to go home. EEG normal. MRI brain pending.  On further questioning, he denies history of febrile seizures, CNS infection, severe head trauma, or stroke. No family history of epilepsy. He was born full term, normal development. Drinks alcohol every other day.  OBJECTIVE:                                                                                                                           Vital signs in last 24 hours: Temp:  [98 F (36.7 C)-98.4 F (36.9 C)] 98.1 F (36.7 C) (02/03 1157) Pulse Rate:  [57-66] 57 (02/03 0929) Resp:  [16] 16 (02/03 0929) BP: (120-154)/(67-87) 154/87 mmHg (02/03 0929) SpO2:  [96 %-98 %] 98 % (02/03 0929) Weight:  [62.6 kg (138 lb 0.1 oz)] 62.6 kg (138 lb 0.1 oz) (02/03 0500)  Intake/Output from previous day: 02/02 0701 - 02/03 0700 In: 1235 [P.O.:960; I.V.:170; IV Piggyback:105] Out: 175 [Urine:175] Intake/Output this shift: Total I/O In: 360 [P.O.:360] Out: -  Nutritional status: General  Past Medical History  Diagnosis Date  . Prostate cancer   . Anxiety   . Depression   . Sleep deprivation   . Seizures     Neurologic Exam:  Mental Status: Alert, oriented x 4, thought content appropriate. Comprehension, naming, and repetition intact. Speech fluent without evidence of aphasia.  Able to follow 3 step commands without difficulty. Cranial Nerves: II: Discs flat bilaterally; Visual fields grossly normal, pupils equal, round, reactive to light and accommodation III,IV, VI: ptosis not present, extra-ocular motions intact bilaterally V,VII: smile symmetric, facial light touch sensation normal bilaterally VIII: hearing normal  bilaterally IX,X: gag reflex present XI: bilateral shoulder shrug XII: midline tongue extension without atrophy or fasciculations  Motor: Right : Upper extremity   5/5    Left:     Upper extremity   5/5  Lower extremity   5/5  Lower extremity   5/5 Tone and bulk:normal tone throughout; no atrophy noted Sensory: Pinprick and light touch intact throughout, bilaterally Deep Tendon Reflexes:  Right: Upper Extremity   Left: Upper extremity   biceps (C-5 to C-6) 2/4   biceps (C-5 to C-6) 2/4 tricep (C7) 2/4    triceps (C7) 2/4 Brachioradialis (C6) 2/4  Brachioradialis (C6) 2/4  Lower Extremity Lower Extremity  quadriceps (L-2 to L-4) 2/4   quadriceps (L-2 to L-4) 2/4 Achilles (S1) 2/4   Achilles (S1) 2/4  Plantars: Right: downgoing   Left: downgoing Cerebellar: normal finger-to-nose,  normal heel-to-shin test Gait: No tested. CV: pulses palpable throughout    Lab Results: No results found for this basename: cbc, bmp, coags, chol, tri, ldl, hga1c   Lipid Panel No results found for this basename: CHOL, TRIG, HDL, CHOLHDL, VLDL, LDLCALC,  in the last 72 hours  Studies/Results: No results found.  MEDICATIONS                                                                                                                       I have reviewed the patient's current medications.  ASSESSMENT/PLAN:                                                                                                            63 y/o with new onset GTC seizures, most likely provoked alcohol related seizures. CT brain and EEG normal. Will cancel MRI brain and stop keppra. Follow up with neurology as outpatient. Will sign off.  Dorian Pod, MD [facility] [phone]  01/12/2014, 12:03 PM

## 2014-01-12 NOTE — Procedures (Signed)
EEG report.  Brief clinical history: 63 yo male with h/o prostate cancer, alcohol abuse, substance abuse presenting with multiple seizures and remains encephalopathic. Concern of non convulsive SE.   Technique: this is a 17 channel routine scalp EEG performed at the bedside with bipolar and monopolar montages arranged in accordance to the international 10/20 system of electrode placement. One channel was dedicated to EKG recording.  The study was performed during wakefulness, drowsiness, and stage 2 sleep. No activating procedures.  Description:In the wakeful state, the best background consisted of a medium amplitude, posterior dominant, well sustained, symmetric and reactive 11 Hz rhythm. Drowsiness demonstrated dropout of the alpha rhythm. Stage 2 sleep showed symmetric and synchronous sleep spindles without intermixed epileptiform discharges. No focal or generalized epileptiform discharges noted.  No pathologic areas of slowing seen.  EKG showed sinus rhythm.  Impression: this is a normal awake and asleep EEG. Please, be aware that a normal EEG does not exclude the possibility of epilepsy.  Clinical correlation is advised.  Dorian Pod, MD

## 2014-01-12 NOTE — Discharge Summary (Signed)
Physician Discharge Summary  Roger Savage [id number] DOB: [birth year] DOA: 01/09/2014  PCP: No PCP Per Patient  Admit date: 01/09/2014 Discharge date: 01/12/2014  Time spent: >45 minutes    Discharge Diagnoses:  Active Problems:   Seizure   Alcohol use  Discharge Condition: stable  Diet recommendation: heart healthy  Filed Weights   01/10/14 1200 01/11/14 0500 01/12/14 0500  Weight: 63 kg (138 lb 14.2 oz) 62.6 kg (138 lb 0.1 oz) 62.6 kg (138 lb 0.1 oz)    History of present illness:  63 yo male with h/o prostate cancer, alcohol abuse and substance abuse who was transported to ED by EMS for seizures. Per report, he had been binge drinking and was with friends who noted that he had 2 seizures that resolved spontaneously and was found to be post-ictal afterward. He has no known history of seizures in the past. In the ED, he did have one seizure with lip smacking and nystagmus, which became a generalized tonic clonic seizure for which he received a dose of Ativan. He subsequently again showed signs of lip smacking and got a second dose of Ativan. He remained encephalopathic.   Hospital Course:  Seizure  - new onset seizure of unclear etiology  - EEG negative for seizure activity - Keppra started by Neuro but discontinued today by neurology after a better history obtained from patient - he will not be discharged home with it.  - he has ambulated without difficulty- no need for PT as outpt  Encephalopathy  -Resolved yesterday and likely due to postictal state - no signs of alcohol withdrawal  Polysubstance abuse including EtOH  - Per patient, he drinks every other day and was not on a daily drinking binge - no significant withdrawal noted during hospitalization - UDS negative for drugs - d/c CIWA scale  Lactic acidosis  Resolved   Hypokalemia  - replaced and normalized  Hypophosphatemia  likely due to poor nutrition due to EtOH, as well as some GI losses w/ diarrhea -  replaced and normalized  Hx of prostate CA  - treated  Macrocytic anemia  B12 and folate within normal range   Procedures:  2/3 EEG- this is a normal awake and asleep EEG. Please, be aware that a normal EEG does not exclude the possibility of epilepsy.    Consultations:  Neuro  Discharge Exam: Filed Vitals:   01/12/14 1157  BP: 154/87  Pulse: 57  Temp: 98.1 F (36.7 C)  Resp: 14    General: AAO x 3, no distress Cardiovascular: RRR, no murmurs Respiratory: CTA b/l  Neuro- CN 2-12 intact, strength intact in all extermities  Discharge Instructions      Discharge Orders   Future Orders Complete By Expires   Diet - low sodium heart healthy  As directed    Discharge instructions  As directed    Comments:     For your health, stop drinking alcohol completely.   Increase activity slowly  As directed        Medication List    STOP taking these medications       HYDROcodone-acetaminophen 5-325 MG per tablet  Commonly known as:  NORCO/VICODIN      TAKE these medications       albuterol 108 (90 BASE) MCG/ACT inhaler  Commonly known as:  PROVENTIL HFA;VENTOLIN HFA  Inhale 2 puffs into the lungs 4 (four) times daily.     alprostadil 40 MCG injection  Commonly known as:  EDEX  40 mcg by  Intracavitary route daily. use no more than 3 times per week     PARoxetine 40 MG tablet  Commonly known as:  PAXIL  Take 40 mg by mouth every morning.     phenazopyridine 200 MG tablet  Commonly known as:  PYRIDIUM  Take 1 tablet (200 mg total) by mouth 3 (three) times daily as needed for pain.     senna-docusate 8.6-50 MG per tablet  Commonly known as:  Senokot-S  Take 2 tablets by mouth 2 (two) times daily as needed (for constipation).     traZODone 150 MG tablet  Commonly known as:  DESYREL  Take 300 mg by mouth at bedtime as needed for sleep.     Vitamin D3 2000 UNITS Tabs  Take 2,000 Units by mouth daily.       No Known Allergies    The results of  significant diagnostics from this hospitalization (including imaging, microbiology, ancillary and laboratory) are listed below for reference.    Significant Diagnostic Studies: Ct Head Wo Contrast  01/09/2014   CLINICAL DATA:  A witnessed seizure  EXAM: CT HEAD WITHOUT CONTRAST  TECHNIQUE: Contiguous axial images were obtained from the base of the skull through the vertex without intravenous contrast.  COMPARISON:  None.  FINDINGS: No acute intracranial hemorrhage. No focal mass lesion. No CT evidence of acute infarction. No midline shift or mass effect. No hydrocephalus. Basilar cisterns are patent.  There is generalized cortical atrophy. There is periventricular and subcortical white matter hypodensities.  There is high-density thickening along the interhemispheric fissure which is felt to represent benign dural calcification and is symmetric from left-to-right.  Paranasal sinuses and  mastoid air cells are clear.  IMPRESSION: 1. No acute intracranial findings. 2. No evidence of acute trauma. 3. Atrophy and microvascular disease noted.   Electronically Signed   By: Suzy Bouchard M.D.   On: 01/09/2014 23:39   Dg Chest Port 1 View  01/10/2014   CLINICAL DATA:  Seizure  EXAM: PORTABLE CHEST - 1 VIEW  COMPARISON:  DG CHEST 1V PORT dated 04/09/2010  FINDINGS: Normal cardiac silhouette. No effusion, infiltrate, or pneumothorax. No acute osseous abnormality.  IMPRESSION: No acute cardiopulmonary process.   Electronically Signed   By: Suzy Bouchard M.D.   On: 01/10/2014 00:32    Microbiology: Recent Results (from the past 240 hour(s))  CULTURE, BLOOD (ROUTINE X 2)     Status: None   Collection Time    01/09/14 11:20 PM      Result Value Range Status   Specimen Description BLOOD RIGHT ARM   Final   Special Requests BOTTLES DRAWN AEROBIC AND ANAEROBIC 10CC EA   Final   Culture  Setup Time     Final   Value: 01/10/2014 05:35     Performed at [facility]   Culture     Final   Value:         BLOOD CULTURE RECEIVED NO GROWTH TO DATE CULTURE WILL BE HELD FOR 5 DAYS BEFORE ISSUING A FINAL NEGATIVE REPORT     Performed at [facility]   Report Status PENDING   Incomplete  CULTURE, BLOOD (ROUTINE X 2)     Status: None   Collection Time    01/09/14 11:30 PM      Result Value Range Status   Specimen Description BLOOD RIGHT HAND   Final   Special Requests BOTTLES DRAWN AEROBIC ONLY 10CC   Final   Culture  Setup Time  Final   Value: 01/10/2014 05:35     Performed at [facility]   Culture     Final   Value:        BLOOD CULTURE RECEIVED NO GROWTH TO DATE CULTURE WILL BE HELD FOR 5 DAYS BEFORE ISSUING A FINAL NEGATIVE REPORT     Performed at [facility]   Report Status PENDING   Incomplete  MRSA PCR SCREENING     Status: None   Collection Time    01/10/14  6:15 AM      Result Value Range Status   MRSA by PCR NEGATIVE  NEGATIVE Final   Comment:            The GeneXpert MRSA Assay (FDA     approved for NASAL specimens     only), is one component of a     comprehensive MRSA colonization     surveillance program. It is not     intended to diagnose MRSA     infection nor to guide or     monitor treatment for     MRSA infections.     Labs: Basic Metabolic Panel:  Recent Labs Lab 01/09/14 2252 01/10/14 0338 01/11/14 0254 01/12/14 0249  NA 144  --  141 144  K 4.0  --  3.3* 4.2  CL 96  --  107 105  CO2 <7*  --  20 23  GLUCOSE 139*  --  132* 95  BUN 4*  --  7 5*  CREATININE 1.03 0.84 0.83 0.92  CALCIUM 10.0  --  8.0* 9.6  MG  --   --  2.1  --   PHOS  --   --  1.7* 2.9   Liver Function Tests:  Recent Labs Lab 01/09/14 2252 01/12/14 0249  AST 39* 31  ALT 20 15  ALKPHOS 124* 132*  BILITOT 0.4 0.5  PROT 9.5* 8.0  ALBUMIN 4.3 3.5   No results found for this basename: LIPASE, AMYLASE,  in the last 168 hours No results found for this basename: AMMONIA,  in the last 168 hours CBC:  Recent Labs Lab 01/09/14 2252 01/10/14 0338  01/11/14 0254  WBC 13.0* 14.1* 8.7  HGB 16.8 13.1 11.6*  HCT 47.9 37.0* 33.4*  MCV 102.8* 98.1 99.7  PLT 238 174 159   Cardiac Enzymes:  Recent Labs Lab 01/09/14 2341  CKTOTAL 279*   BNP: BNP (last 3 results) No results found for this basename: PROBNP,  in the last 8760 hours CBG:  Recent Labs Lab 01/09/14 2242 01/10/14 0621  GLUCAP 135* 96       Signed:  Mulat  [facility] 01/12/2014, 12:52 PM

## 2014-01-12 NOTE — Progress Notes (Signed)
Spoke Roger Savage in MRI, patient still awaiting MRI per radiology.  Desmond Dike RN

## 2014-01-15 ENCOUNTER — Emergency Department (HOSPITAL_COMMUNITY)
Admission: EM | Admit: 2014-01-15 | Discharge: 2014-01-15 | Disposition: A | Discharge status: Home / Self Care | Payer: Non-veteran care | Attending: Emergency Medicine | Admitting: Emergency Medicine

## 2014-01-15 ENCOUNTER — Encounter (HOSPITAL_COMMUNITY): Payer: Self-pay | Admitting: Emergency Medicine

## 2014-01-15 DIAGNOSIS — F411 Generalized anxiety disorder: Secondary | ICD-10-CM | POA: Insufficient documentation

## 2014-01-15 DIAGNOSIS — R569 Unspecified convulsions: Secondary | ICD-10-CM

## 2014-01-15 DIAGNOSIS — F10239 Alcohol dependence with withdrawal, unspecified: Secondary | ICD-10-CM | POA: Insufficient documentation

## 2014-01-15 DIAGNOSIS — Z8669 Personal history of other diseases of the nervous system and sense organs: Secondary | ICD-10-CM | POA: Insufficient documentation

## 2014-01-15 DIAGNOSIS — Z87891 Personal history of nicotine dependence: Secondary | ICD-10-CM | POA: Insufficient documentation

## 2014-01-15 DIAGNOSIS — Z8546 Personal history of malignant neoplasm of prostate: Secondary | ICD-10-CM | POA: Insufficient documentation

## 2014-01-15 DIAGNOSIS — F329 Major depressive disorder, single episode, unspecified: Secondary | ICD-10-CM | POA: Insufficient documentation

## 2014-01-15 DIAGNOSIS — F3289 Other specified depressive episodes: Secondary | ICD-10-CM | POA: Insufficient documentation

## 2014-01-15 DIAGNOSIS — Z79899 Other long term (current) drug therapy: Secondary | ICD-10-CM | POA: Insufficient documentation

## 2014-01-15 DIAGNOSIS — F10939 Alcohol use, unspecified with withdrawal, unspecified: Secondary | ICD-10-CM

## 2014-01-15 LAB — COMPREHENSIVE METABOLIC PANEL
ALK PHOS: 105 U/L (ref 39–117)
ALT: 17 U/L (ref 0–53)
AST: 32 U/L (ref 0–37)
Albumin: 4 g/dL (ref 3.5–5.2)
BILIRUBIN TOTAL: 0.2 mg/dL — AB (ref 0.3–1.2)
BUN: 7 mg/dL (ref 6–23)
CALCIUM: 9.7 mg/dL (ref 8.4–10.5)
CHLORIDE: 98 meq/L (ref 96–112)
CO2: 16 mEq/L — ABNORMAL LOW (ref 19–32)
Creatinine, Ser: 0.79 mg/dL (ref 0.50–1.35)
GFR calc non Af Amer: >90 mL/min (ref >90)
Glucose, Bld: 94 mg/dL (ref 70–99)
Potassium: 4 mEq/L (ref 3.7–5.3)
SODIUM: 141 meq/L (ref 137–147)
Total Protein: 8.7 g/dL — ABNORMAL HIGH (ref 6.0–8.3)

## 2014-01-15 LAB — ETHANOL: Alcohol, Ethyl (B): <11 mg/dL (ref 0–11)

## 2014-01-15 LAB — URINALYSIS, ROUTINE W REFLEX MICROSCOPIC
Bilirubin Urine: NEGATIVE
Glucose, UA: NEGATIVE mg/dL
HGB URINE DIPSTICK: NEGATIVE
Ketones, ur: 15 mg/dL — AB
Leukocytes, UA: NEGATIVE
NITRITE: NEGATIVE
PROTEIN: NEGATIVE mg/dL
SPECIFIC GRAVITY, URINE: 1.017 (ref 1.005–1.030)
UROBILINOGEN UA: 0.2 mg/dL (ref 0.0–1.0)
pH: 5 (ref 5.0–8.0)

## 2014-01-15 LAB — CBC WITH DIFFERENTIAL/PLATELET
BASOS ABS: 0 10*3/uL (ref 0.0–0.1)
Basophils Relative: 0 % (ref 0–1)
EOS PCT: 0 % (ref 0–5)
Eosinophils Absolute: 0 10*3/uL (ref 0.0–0.7)
HEMATOCRIT: 39.3 % (ref 39.0–52.0)
Hemoglobin: 13.9 g/dL (ref 13.0–17.0)
LYMPHS ABS: 0.6 10*3/uL — AB (ref 0.7–4.0)
Lymphocytes Relative: 8 % — ABNORMAL LOW (ref 12–46)
MCH: 34.8 pg — ABNORMAL HIGH (ref 26.0–34.0)
MCHC: 35.4 g/dL (ref 30.0–36.0)
MCV: 98.3 fL (ref 78.0–100.0)
Monocytes Absolute: 0.3 10*3/uL (ref 0.1–1.0)
Monocytes Relative: 4 % (ref 3–12)
Neutro Abs: 6.2 10*3/uL (ref 1.7–7.7)
Neutrophils Relative %: 88 % — ABNORMAL HIGH (ref 43–77)
PLATELETS: 260 10*3/uL (ref 150–400)
RBC: 4 MIL/uL — AB (ref 4.22–5.81)
RDW: 13 % (ref 11.5–15.5)
WBC: 7.1 10*3/uL (ref 4.0–10.5)

## 2014-01-15 LAB — GLUCOSE, CAPILLARY: GLUCOSE-CAPILLARY: 96 mg/dL (ref 70–99)

## 2014-01-15 LAB — RAPID URINE DRUG SCREEN, HOSP PERFORMED
Amphetamines: NOT DETECTED
BARBITURATES: NOT DETECTED
BENZODIAZEPINES: NOT DETECTED
Cocaine: POSITIVE — AB
Opiates: NOT DETECTED
Tetrahydrocannabinol: POSITIVE — AB

## 2014-01-15 MED ORDER — SODIUM CHLORIDE 0.9 % IV BOLUS (SEPSIS)
1000.0000 mL | Freq: Once | INTRAVENOUS | Status: AC
  Administered 2014-01-15: 1000 mL via INTRAVENOUS

## 2014-01-15 MED ORDER — LORAZEPAM 2 MG/ML IJ SOLN
1.0000 mg | Freq: Once | INTRAMUSCULAR | Status: AC
  Administered 2014-01-15: 1 mg via INTRAVENOUS
  Filled 2014-01-15: qty 1

## 2014-01-15 NOTE — ED Notes (Signed)
PT had witnessed seizure at around 1240pm. Witness reports full body seizure lasting about 5 mins. Witness reports PT did not hit his head, and no LOC. PT reports he had one seizure about 2 weeks ago and was hospitalized; denies hx of seizures otherwise. PT states he has been trying to quit drinking alcohol and this is when the seizures began.

## 2014-01-15 NOTE — ED Notes (Signed)
Pt arrived by [facility] from home with c/o seizure. EMS stated that the fire dept was called out a couple of hours ago for the same call and that when they arrived pt was A&O. This time pt was postictal and diaphoretic when EMS arrived. Pt had his first seizure a week ago and x2 today. Pt does not remember either of the seizures today. Currently A&O. Pt is not on any medication for seizures. Denies any pain and stated to EMS that he feels normal.  BP-143/99 HR-104 Resp 18 [id number]

## 2014-01-15 NOTE — Discharge Instructions (Signed)
Alcohol Withdrawal °Anytime drug use is interfering with normal living activities it has become abuse. This includes problems with family and friends. Psychological dependence has developed when your mind tells you that the drug is needed. This is usually followed by physical dependence when a continuing increase of drugs are required to get the same feeling or "high." This is known as addiction or chemical dependency. A person's risk is much higher if there is a history of chemical dependency in the family. °Mild Withdrawal Following Stopping Alcohol, When Addiction or Chemical Dependency Has Developed °When a person has developed tolerance to alcohol, any sudden stopping of alcohol can cause uncomfortable physical symptoms. Most of the time these are mild and consist of tremors in the hands and increases in heart rate, breathing, and temperature. Sometimes these symptoms are associated with anxiety, panic attacks, and bad dreams. There may also be stomach upset. Normal sleep patterns are often interrupted with periods of inability to sleep (insomnia). This may last for 6 months. Because of this discomfort, many people choose to continue drinking to get rid of this discomfort and to try to feel normal. °Severe Withdrawal with Decreased or No Alcohol Intake, When Addiction or Chemical Dependency Has Developed °About five percent of alcoholics will develop signs of severe withdrawal when they stop using alcohol. One sign of this is development of generalized seizures (convulsions). Other signs of this are severe agitation and confusion. This may be associated with believing in things which are not real or seeing things which are not really there (delusions and hallucinations). Vitamin deficiencies are usually present if alcohol intake has been long-term. Treatment for this most often requires hospitalization and close observation. °Addiction can only be helped by stopping use of all chemicals. This is hard but may  save your life. With continual alcohol use, possible outcomes are usually loss of self respect and esteem, violence, and death. °Addiction cannot be cured but it can be stopped. This often requires outside help and the care of professionals. Treatment centers are listed in the yellow pages under Cocaine, Narcotics, and Alcoholics Anonymous. Most hospitals and clinics can refer you to a specialized care center. °It is not necessary for you to go through the uncomfortable symptoms of withdrawal. Your caregiver can provide you with medicines that will help you through this difficult period. Try to avoid situations, friends, or drugs that made it possible for you to keep using alcohol in the past. Learn how to say no. °It takes a long period of time to overcome addictions to all drugs, including alcohol. There may be many times when you feel as though you want a drink. After getting rid of the physical addiction and withdrawal, you will have a lessening of the craving which tells you that you need alcohol to feel normal. Call your caregiver if more support is needed. Learn who to talk to in your family and among your friends so that during these periods you can receive outside help. Alcoholics Anonymous (AA) has helped many people over the years. To get further help, contact AA or call your caregiver, counselor, or clergyperson. Al-Anon and Alateen are support groups for friends and family members of an alcoholic. The people who love and care for an alcoholic often need help, too. For information about these organizations, check your phone directory or call a local alcoholism treatment center.  °SEEK IMMEDIATE MEDICAL CARE IF:  °· You have a seizure. °· You have a fever. °· You experience uncontrolled vomiting or you   vomit up blood. This may be bright red or look like black coffee grounds.  You have blood in the stool. This may be bright red or appear as a black, tarry, bad-smelling stool.  You become lightheaded or  faint. Do not drive if you feel this way. Have someone else drive you or call 409 for help.  You become more agitated or confused.  You develop uncontrolled anxiety.  You begin to see things that are not really there (hallucinate). Your caregiver has determined that you completely understand your medical condition, and that your mental state is back to normal. You understand that you have been treated for alcohol withdrawal, have agreed not to drink any alcohol for a minimum of 1 day, will not operate a car or other machinery for 24 hours, and have had an opportunity to ask any questions about your condition. Document Released: 09/05/2005 Document Revised: 02/18/2012 Document Reviewed: 07/14/2008 [facility] Patient Information 2014 [place], [place].  Driving and Equipment Restrictions Some medical problems make it dangerous to drive, ride a bike, or use machines. Some of these problems are:  A hard blow to the head (concussion).  Passing out (fainting).  Twitching and shaking (seizures).  Low blood sugar.  Taking medicine to help you relax (sedatives).  Taking pain medicines.  Wearing an eye patch.  Wearing splints. This can make it hard to use parts of your body that you need to drive safely. HOME CARE   Do not drive until your doctor says it is okay.  Do not use machines until your doctor says it is okay. You may need a form signed by your doctor (medical release) before you can drive again. You may also need this form before you do other tasks where you need to be fully alert. MAKE SURE YOU:  Understand these instructions.  Will watch your condition.  Will get help right away if you are not doing well or get worse. Document Released: 01/03/2005 Document Revised: 02/18/2012 Document Reviewed: 04/05/2010 [facility] Patient Information 2014 [place], [place].  Seizure, Adult A seizure is abnormal electrical activity in the brain. Seizures usually last from 30 seconds to 2  minutes. There are various types of seizures. Before a seizure, you may have a warning sensation (aura) that a seizure is about to occur. An aura may include the following symptoms:   Fear or anxiety.  Nausea.  Feeling like the room is spinning (vertigo).  Vision changes, such as seeing flashing lights or spots. Common symptoms during a seizure include:  A change in attention or behavior (altered mental status).  Convulsions with rhythmic jerking movements.  Drooling.  Rapid eye movements.  Grunting.  Loss of bladder and bowel control.  Bitter taste in the mouth.  Tongue biting. After a seizure, you may feel confused and sleepy. You may also have an injury resulting from convulsions during the seizure. HOME CARE INSTRUCTIONS   If you are given medicines, take them exactly as prescribed by your health care provider.  Keep all follow-up appointments as directed by your health care provider.  Do not swim or drive or engage in risky activity during which a seizure could cause further injury to you or others until your health care provider says it is OK.  Get adequate rest.  Teach friends and family what to do if you have a seizure. They should:  Lay you on the ground to prevent a fall.  Put a cushion under your head.  Loosen any tight clothing around your neck.  Turn you on your side. If vomiting occurs, this helps keep your airway clear.  Stay with you until you recover.  Know whether or not you need emergency care. SEEK IMMEDIATE MEDICAL CARE IF:  The seizure lasts longer than 5 minutes.  The seizure is severe or you do not wake up immediately after the seizure.  You have an altered mental status after the seizure.  You are having more frequent or worsening seizures. Someone should drive you to the emergency department or call local emergency services (911 in U.[place].). MAKE SURE YOU:  Understand these instructions.  Will watch your condition.  Will get  help right away if you are not doing well or get worse. Document Released: 11/23/2000 Document Revised: 09/16/2013 Document Reviewed: 07/08/2013 [facility] Patient Information 2014 [place].    Emergency Department Resource Guide 1) Find a Doctor and Pay Out of Pocket Although you won't have to find out who is covered by your insurance plan, it is a good idea to ask around and get recommendations. You will then need to call the office and see if the doctor you have chosen will accept you as a new patient and what types of options they offer for patients who are self-pay. Some doctors offer discounts or will set up payment plans for their patients who do not have insurance, but you will need to ask so you aren't surprised when you get to your appointment.  2) Contact Your Local Health Department Not all health departments have doctors that can see patients for sick visits, but many do, so it is worth a call to see if yours does. If you don't know where your local health department is, you can check in your phone book. The CDC also has a tool to help you locate your state's health department, and many state websites also have listings of all of their local health departments.  3) Find a [facility] If your illness is not likely to be very severe or complicated, you may want to try a walk in clinic. These are popping up all over the country in pharmacies, drugstores, and shopping centers. They're usually staffed by nurse practitioners or physician assistants that have been trained to treat common illnesses and complaints. They're usually fairly quick and inexpensive. However, if you have serious medical issues or chronic medical problems, these are probably not your best option.  No Primary Care Doctor: - Call Health Connect at  [phone] - they can help you locate a primary care doctor that  accepts your insurance, provides certain services, etc. - Physician Referral Service-  [phone]  Chronic Pain Problems: Organization         Address  Phone   Notes  [facility]  [phone] Patients need to be referred by their primary care doctor.   Medication Assistance: Organization         Address  Phone   Notes  [facility] Medication [facility] [address] [place], [place] [address] [phone] --Must be a resident of [facility] -- Must have NO insurance coverage whatsoever (no Medicaid/ Medicare, etc.) -- The pt. MUST have a primary care doctor that directs their care regularly and follows them in the community   MedAssist  [phone]   [employer]  [phone]    Agencies that provide inexpensive medical care: Organization         Address  Phone   Notes  [place]  (  [phone]) [phone]   [facility]    [phone](336) [phone]   [facility] [address] [place], [place] [address] [phone]) [phone]   [facility] [address], [place] [phone](336) [phone]   Planned Parenthood    [phone]) [phone]   [facility]    [phone](336) [phone]   [facility]  [address], [place] Phone:  [phone], Fax:  [phone]) [phone] Hours of Operation:  9 am - 6 pm, M-F.  Also accepts Medicaid/Medicare and self-pay.  [facility]  [address], [place] Phone: [phone](336) [phone], Fax: [phone](336) [phone]. Hours of Operation:  8:30 am - 5:30 pm, M-F.  Also accepts Medicaid and self-pay.  [facility] [place] [address], [place] Phone: [phone](336) [phone]   [facility] [address], [place], [place] [phone], Ext. [phone] Mondays & Thursdays: 7-9 AM.  First 15 patients are seen on a first come, first serve basis.    Medicaid-accepting [facility] Providers:  Organization         Address  Phone   Notes  [facility] [address],  [place] [phone]) [phone] Also accepts self-pay patients.  [facility] [address] [place], [place]  [phone](336) [phone]   [facility] [address], [place] [phone]) [phone]   [facility] [address] [place] [address], [place] [phone]) [phone]   Renaye RakersVeita Bland [address], [place]   [phone]) [phone] Only accepts [place] Access [place] patients after they have their name applied to their card.   Self-Pay (no insurance) in [facility]:  Organization         Address  Phone   Notes  Sickle Cell Patients, [facility] [address], [place] [phone]) [phone]   [facility] [address], [place] [phone]) [phone]   [facility] [place]  [address],  [phone]   [facility]/Dr. Osei-Bonsu  [address] [place] [address], [place] or [phone], [place] [phone]) [phone] Phone number for both [place] and [place] locations is the same.  [facility] [address], [place] [phone]) [phone]   [facility] [place] [address] [place] [address], [place] or [address] [phone]) [phone]) [phone]   [facility] [address], [place] [phone]) [phone], phone; [phone](336) [phone], fax Sees patients 1st and 3rd Saturday of every month.  Must not qualify for public or private insurance (i.e. Medicaid, Medicare, [place] Health Choice, Veterans' Benefits)  Household income should be no more than 200% of the poverty level The clinic cannot treat you if you are pregnant or think you are pregnant  Sexually transmitted diseases are not treated at the clinic.    Dental Care: Organization         Address  Phone  Notes  [facility] Department of [facility] [address], [place] [phone]) [phone] Accepts children up to age 61 who are enrolled in  [place] or [place] Health Choice; pregnant women with a Medicaid card; and children who have applied for Medicaid or [place] Health Choice, but were declined, whose parents can pay a reduced fee at time of service.  [facility] of [facility] [place]  [address], [employer] [phone] Accepts children up to age 65 who are enrolled in Medicaid or [place]; pregnant women with a Medicaid card; and children who have applied for Medicaid or [place] Health Choice, but were declined, whose parents can pay a reduced fee at time of service.  [facility] Access PROGRAM  [place] [phone] Patients are seen by appointment only. Walk-ins are not accepted. [place] will see patients 68 years of age and older. Monday - Tuesday (8am-5pm) Most Wednesdays (8:30-5pm) $30 per visit, cash only  [facility] Adult Dental Access PROGRAM  [address], [facility] [phone] Patients are seen by appointment only. Walk-ins are not accepted. [place] will see patients 76 years of age and older. One Wednesday Evening (Monthly: Volunteer Based).  $30 per visit, cash only  [place]  [phone] for adults; Children under age 62, call Graduate Pediatric Dentistry at [phone]. Children aged 63-14, please call [phone] to request a pediatric application.  Dental services are provided in all areas of dental care including fillings, crowns and bridges, complete and partial dentures, implants, gum treatment, root canals, and extractions. Preventive care is also provided. Treatment is provided to both adults and children. Patients are selected via a lottery and there is often a waiting list.   [facility] [address], [place]  [phone] [email or web address]   [facility] [address] [place], [place] [phone], Ext. [phone] Second and Fourth Thursday of each month, opens at 6:30  AM; Clinic ends at 9 AM.  Patients are seen on a first-come first-served basis, and a limited number are seen during each clinic.   [facility]  [address] Hillard Danker [place], [place] [phone]   Eligibility Requirements You must have lived in [place], [place], or [place] counties for at least the last three months.   You cannot be eligible for state or federal sponsored [employer], including [employer], [place], or [employer].   You generally cannot be eligible for healthcare insurance through your employer.    How to apply: Eligibility screenings are held every Tuesday and Wednesday afternoon from 1:00 pm until 4:00 pm. You do not need an appointment for the interview!  [facility] [address] [place], [place]   [place]  [facility] Department  [place]  [phone]    Behavioral Health Resources in the Community: Intensive Outpatient Programs Organization         Address  Phone  Notes  [place] [address], [place], [place] [phone]   [facility] Outpatient [address], [place], [place]   ADS: [facility] Drug S[facility] [address] [place], [place]   [place] [address],  [place], [place] or [phone]   Substance Abuse Resources Organization         Address  Phone  Notes  Alcohol [facility] Drug Services  [phone]   [place]  [phone]   The [place]   Chinita Pester  [phone]   Residential & Outpatient Substance Abuse Program  [phone]   Psychological Services Organization         Address  Phone  Notes  [place]  [place]  Services  [phone]   [facility] [address] [place] [phone] or  [phone]    Mobile Crisis Teams Organization         Address  Phone  Notes  [facility], Mobile Crisis Care Unit  [phone]   [facility]  [address] [place], [place] [phone]   Doristine Locks [address] [place] [phone]    Self-Help/Support Groups Organization         Address  Phone             Notes  [facility] [place] - variety of support groups  336- I7437963 Call for more information  Narcotics Anonymous (NA), [facility] [address], [employer] Erma  2 meetings at this location   Statistician         Address  Phone  Notes  [facility] [address],    [place]  [phone]   [facility]  [address], [place] [address]84, [place], [place] [phone]   [facility] [address] [place], [place] [phone] Admissions: 8am-3pm M-F  [facility] [address]    [place], [place] [phone]   [facility] [address] [place], [place], [place] [phone]   [facility] [address],  [place], [place] [phone]   [facility] - Intensive Outpatient [address], [place], [place] [phone]   [facility] [address] [place].,  [place], [place] [phone] or [phone]   [facility] ([facility]) [address], [place], [place] [phone] Accepts Medicaid  [facility]ship Wimer [address]  [place] [phone] Substance Abuse/Addiction Treatment   [facility] Organization         Address  Phone  Notes  [facility]  [phone]   Angie Fava, PhD [address] Knack [place], [place]   [phone] or [phone]   [facility]   [address] [place], [place] [phone]   [facility] [address]  [place], [place] [phone] Insurance/Medicaid/sponsorship through [facility] and Families [address]                                    [place], [place] [phone] Therapy/tele-psych/case  [facility] [address], [place] [phone]    Dr. Lolly Mustache  [phone]   [facility]  [employer] [facility] Dept. 1) [address] [place] 2) [address], [place] 3)  [address], [place] [phone]  [phone]   [facility] Child Abuse Hotline [phone] or [phone] (After Hours)

## 2014-01-15 NOTE — ED Provider Notes (Signed)
TIME SEEN: 1:38 PM  CHIEF COMPLAINT: Seizure  HPI: Patient is a 63 y.o. M with history of prostate cancer in remission, anxiety and depression, substance abuse, recent admission to the hospital for seizures thought due to to his alcohol abuse who presents emergency department with a generalized tonic-clonic seizure that was witnessed by his friend today. This lasted for several minutes and patient was post ictal afterwards. There was no tongue biting or incontinence. He was sitting on the couch when this happened and fell over into his friend's lap. There was no head injury. Patient reports that he is trying to stop drinking and is trying to do this on his and. He reports his last drink was last night. Denies any recent illicit drug use. Denies any numbness, focal weakness, headaches. He is not on antiepileptics per neurology recommendation. During his admission in the hospital, patient had normal head CT and EEG was no epileptiform activity. He did not have a brain MRI. He denies any recent infectious symptoms. He states he does not want to come into the hospital for alcohol detox. He is aware that if he is discharged home, he may have another seizure if he does not drink alcohol.  ROS: See HPI Constitutional: no fever  Eyes: no drainage  ENT: no runny nose   Cardiovascular:  no chest pain  Resp: no SOB  GI: no vomiting GU: no dysuria Integumentary: no rash  Allergy: no hives  Musculoskeletal: no leg swelling  Neurological: no slurred speech ROS otherwise negative  PAST MEDICAL HISTORY/PAST SURGICAL HISTORY:  Past Medical History  Diagnosis Date  . Prostate cancer   . Anxiety   . Depression   . Sleep deprivation   . Seizures     MEDICATIONS:  Prior to Admission medications   Medication Sig Start Date End Date Taking? Authorizing Provider  albuterol (PROVENTIL HFA;VENTOLIN HFA) 108 (90 BASE) MCG/ACT inhaler Inhale 2 puffs into the lungs 4 (four) times daily.   Yes Historical  Provider, MD  alprostadil (EDEX) 40 MCG injection 40 mcg by Intracavitary route daily. use no more than 3 times per week   Yes Historical Provider, MD  Cholecalciferol (VITAMIN D3) 2000 UNITS TABS Take 2,000 Units by mouth daily.   Yes Historical Provider, MD  PARoxetine (PAXIL) 40 MG tablet Take 40 mg by mouth every morning.   Yes Historical Provider, MD  senna-docusate (SENOKOT-S) 8.6-50 MG per tablet Take 2 tablets by mouth 2 (two) times daily as needed (for constipation).   Yes Historical Provider, MD  traZODone (DESYREL) 150 MG tablet Take 300 mg by mouth at bedtime as needed for sleep.   Yes Historical Provider, MD    ALLERGIES:  No Known Allergies  SOCIAL HISTORY:  History  Substance Use Topics  . Smoking status: Former Research scientist (life sciences)  . Smokeless tobacco: Not on file  . Alcohol Use: 3.6 oz/week    6 Cans of beer per week    FAMILY HISTORY: No family history on file.  EXAM: There were no vitals taken for this visit. CONSTITUTIONAL: Alert and oriented and responds appropriately to questions. Well-appearing; well-nourished HEAD: Normocephalic EYES: Conjunctivae clear, PERRL ENT: normal nose; no rhinorrhea; moist mucous membranes; pharynx without lesions noted NECK: Supple, no meningismus, no LAD  CARD: RRR; S1 and S2 appreciated; no murmurs, no clicks, no rubs, no gallops RESP: Normal chest excursion without splinting or tachypnea; breath sounds clear and equal bilaterally; no wheezes, no rhonchi, no rales,  ABD/GI: Normal bowel sounds; non-distended; soft, non-tender, no  rebound, no guarding BACK:  The back appears normal and is non-tender to palpation, there is no CVA tenderness EXT: Normal ROM in all joints; non-tender to palpation; no edema; normal capillary refill; no cyanosis    SKIN: Normal color for age and race; warm NEURO: Moves all extremities equally, strength 5/5 in all 4 extremities, cranial nerves II through XII intact, sensation to light touch intact diffusely,  oriented x3 PSYCH: The patient's mood and manner are appropriate. Grooming and personal hygiene are appropriate.  MEDICAL DECISION MAKING: Patient here after seizure likely due from alcohol withdrawal. He is currently neurologically intact, hemodynamically stable. No signs of trauma on exam. Will obtain basic labs, urine. Will give Ativan. Will continue to monitor.  ED PROGRESS: Patient's labs, urine are unremarkable. His alcohol level is 0. Discussed with patient and his sister at the bedside. Patient reports that he is not ready to quit drinking and does not want to stay for medical detox. Given patient does not want to abstain from alcohol, I will not send him home with a benzodiazepine taper. He understands that if he does not continue to drink he will have another seizure. He is oriented x3 and he verbalized risks back to me. His sister is very aware of this as well and is comfortable with the plans to be discharged home. Have given strict return precautions. Patient and sister verbalize understanding and are comfortable with plan.   EKG Interpretation    Date/Time:  Friday January 15 2014 13:32:45 EST Ventricular Rate:  101 PR Interval:  134 QRS Duration: 86 QT Interval:  397 QTC Calculation: 515 R Axis:   59 Text Interpretation:  Sinus tachycardia Prolonged QT interval Confirmed by Deloras Reichard  DO, Dedrick Heffner ([id number]) on 01/15/2014 1:47:09 PM             [place], DO 01/15/14 1538

## 2014-01-16 LAB — CULTURE, BLOOD (ROUTINE X 2)
CULTURE: NO GROWTH
Culture: NO GROWTH

## 2015-02-05 ENCOUNTER — Emergency Department (HOSPITAL_COMMUNITY)
Admission: EM | Admit: 2015-02-05 | Discharge: 2015-02-05 | Disposition: A | Discharge status: Home / Self Care | Payer: Non-veteran care | Attending: Emergency Medicine | Admitting: Emergency Medicine

## 2015-02-05 ENCOUNTER — Encounter (HOSPITAL_COMMUNITY): Payer: Self-pay | Admitting: *Deleted

## 2015-02-05 DIAGNOSIS — T839XXA Unspecified complication of genitourinary prosthetic device, implant and graft, initial encounter: Secondary | ICD-10-CM

## 2015-02-05 DIAGNOSIS — T83098A Other mechanical complication of other indwelling urethral catheter, initial encounter: Secondary | ICD-10-CM | POA: Insufficient documentation

## 2015-02-05 DIAGNOSIS — F419 Anxiety disorder, unspecified: Secondary | ICD-10-CM | POA: Insufficient documentation

## 2015-02-05 DIAGNOSIS — Y846 Urinary catheterization as the cause of abnormal reaction of the patient, or of later complication, without mention of misadventure at the time of the procedure: Secondary | ICD-10-CM | POA: Insufficient documentation

## 2015-02-05 DIAGNOSIS — Z79899 Other long term (current) drug therapy: Secondary | ICD-10-CM | POA: Insufficient documentation

## 2015-02-05 DIAGNOSIS — Z87891 Personal history of nicotine dependence: Secondary | ICD-10-CM | POA: Insufficient documentation

## 2015-02-05 DIAGNOSIS — R319 Hematuria, unspecified: Secondary | ICD-10-CM | POA: Insufficient documentation

## 2015-02-05 DIAGNOSIS — F329 Major depressive disorder, single episode, unspecified: Secondary | ICD-10-CM | POA: Insufficient documentation

## 2015-02-05 DIAGNOSIS — Z8546 Personal history of malignant neoplasm of prostate: Secondary | ICD-10-CM | POA: Insufficient documentation

## 2015-02-05 LAB — BASIC METABOLIC PANEL
Anion gap: 11 (ref 5–15)
BUN: 9 mg/dL (ref 6–23)
CO2: 26 mmol/L (ref 19–32)
CREATININE: 1.05 mg/dL (ref 0.50–1.35)
Calcium: 9.9 mg/dL (ref 8.4–10.5)
Chloride: 102 mmol/L (ref 96–112)
GFR calc non Af Amer: 74 mL/min — ABNORMAL LOW (ref >90)
GFR, EST AFRICAN AMERICAN: 86 mL/min — AB (ref >90)
GLUCOSE: 86 mg/dL (ref 70–99)
POTASSIUM: 4.3 mmol/L (ref 3.5–5.1)
Sodium: 139 mmol/L (ref 135–145)

## 2015-02-05 LAB — CBC WITH DIFFERENTIAL/PLATELET
BASOS ABS: 0 10*3/uL (ref 0.0–0.1)
Basophils Relative: 0 % (ref 0–1)
Eosinophils Absolute: 0.1 10*3/uL (ref 0.0–0.7)
Eosinophils Relative: 2 % (ref 0–5)
HCT: 35.6 % — ABNORMAL LOW (ref 39.0–52.0)
Hemoglobin: 12.5 g/dL — ABNORMAL LOW (ref 13.0–17.0)
LYMPHS ABS: 1.6 10*3/uL (ref 0.7–4.0)
LYMPHS PCT: 30 % (ref 12–46)
MCH: 33.9 pg (ref 26.0–34.0)
MCHC: 35.1 g/dL (ref 30.0–36.0)
MCV: 96.5 fL (ref 78.0–100.0)
Monocytes Absolute: 0.4 10*3/uL (ref 0.1–1.0)
Monocytes Relative: 8 % (ref 3–12)
NEUTROS ABS: 3.3 10*3/uL (ref 1.7–7.7)
Neutrophils Relative %: 60 % (ref 43–77)
Platelets: 236 10*3/uL (ref 150–400)
RBC: 3.69 MIL/uL — AB (ref 4.22–5.81)
RDW: 13.1 % (ref 11.5–15.5)
WBC: 5.4 10*3/uL (ref 4.0–10.5)

## 2015-02-05 LAB — URINALYSIS, ROUTINE W REFLEX MICROSCOPIC
BILIRUBIN URINE: NEGATIVE
GLUCOSE, UA: NEGATIVE mg/dL
Ketones, ur: NEGATIVE mg/dL
NITRITE: NEGATIVE
Protein, ur: >300 mg/dL — AB
Specific Gravity, Urine: 1.016 (ref 1.005–1.030)
UROBILINOGEN UA: 0.2 mg/dL (ref 0.0–1.0)
pH: 6.5 (ref 5.0–8.0)

## 2015-02-05 LAB — URINE MICROSCOPIC-ADD ON

## 2015-02-05 LAB — PROTIME-INR
INR: 1.06 (ref 0.00–1.49)
Prothrombin Time: 13.9 seconds (ref 11.6–15.2)

## 2015-02-05 MED ORDER — ONDANSETRON HCL 4 MG/2ML IJ SOLN
4.0000 mg | Freq: Once | INTRAMUSCULAR | Status: AC
  Administered 2015-02-05: 4 mg via INTRAVENOUS
  Filled 2015-02-05: qty 2

## 2015-02-05 MED ORDER — FENTANYL CITRATE 0.05 MG/ML IJ SOLN
50.0000 ug | Freq: Once | INTRAMUSCULAR | Status: AC
  Administered 2015-02-05: 50 ug via INTRAVENOUS
  Filled 2015-02-05: qty 2

## 2015-02-05 NOTE — Discharge Instructions (Signed)
Follow-up with your urology folks at the [facility] next week. Return for any decreased urine flow or increased bleeding.

## 2015-02-05 NOTE — ED Provider Notes (Signed)
CSN: [id number]     Arrival date & time 02/05/15  1211 History   First MD Initiated Contact with Patient 02/05/15 1443     Chief Complaint  Patient presents with  . Hematuria     (Consider location/radiation/quality/duration/timing/severity/associated sxs/prior Treatment) HPI The patient had prostate surgery 2 weeks ago at the [place]. He reports one week ago he had to have a Foley catheter placed for urinary retention. Today at 2:30 AM a lot of blood began coming out into the Foley catheter bag. He also has clots coming out of the end of the penis around the catheter. The patient denies he's having pain or pressure in the suprapubic area but reports he has pressure and discomfort in his penis. He has not had nausea vomiting or fever or chills. Past Medical History  Diagnosis Date  . Prostate cancer   . Anxiety   . Depression   . Sleep deprivation   . Seizures    Past Surgical History  Procedure Laterality Date  . Prostate surgery     History reviewed. No pertinent family history. History  Substance Use Topics  . Smoking status: Former Research scientist (life sciences)  . Smokeless tobacco: Not on file  . Alcohol Use: 3.6 oz/week    6 Cans of beer per week     Comment: PT trying to quit drinking - most recently 3-4cans beer/day    Review of Systems 10 Systems reviewed and are negative for acute change except as noted in the HPI.    Allergies  Review of patient's allergies indicates no known allergies.  Home Medications   Prior to Admission medications   Medication Sig Start Date End Date Taking? Authorizing Provider  albuterol (PROVENTIL HFA;VENTOLIN HFA) 108 (90 BASE) MCG/ACT inhaler Inhale 2 puffs into the lungs 4 (four) times daily.   Yes Historical Provider, MD  Cholecalciferol (VITAMIN D3) 2000 UNITS TABS Take 2,000 Units by mouth daily.   Yes Historical Provider, MD  oxybutynin (DITROPAN) 5 MG tablet Take 5 mg by mouth 4 (four) times daily.   Yes Historical Provider, MD  PARoxetine (PAXIL) 40  MG tablet Take 40 mg by mouth every morning.   Yes Historical Provider, MD  senna-docusate (SENOKOT-S) 8.6-50 MG per tablet Take 2 tablets by mouth 2 (two) times daily as needed (for constipation).   Yes Historical Provider, MD  traZODone (DESYREL) 150 MG tablet Take 300 mg by mouth at bedtime as needed for sleep.   Yes Historical Provider, MD   BP 136/97 mmHg  Pulse 77  Temp(Src) 98.8 F (37.1 C) (Oral)  Resp 12  Ht 5\' 9"  (1.753 m)  Wt 150 lb (68.04 kg)  BMI 22.14 kg/m2  SpO2 100% Physical Exam  Constitutional: He is oriented to person, place, and time. He appears well-developed and well-nourished.  HENT:  Head: Normocephalic and atraumatic.  Eyes: EOM are normal. Pupils are equal, round, and reactive to light.  Neck: Neck supple.  Cardiovascular: Normal rate, regular rhythm, normal heart sounds and intact distal pulses.   Pulmonary/Chest: Effort normal and breath sounds normal.  Abdominal: Soft. Bowel sounds are normal. He exhibits no distension. There is no tenderness.  Genitourinary:  The penis has a Foley catheter in it. There is clot extruding from the meatus around the catheter. The Foley bay has read blood in it.  Musculoskeletal: Normal range of motion. He exhibits no edema.  Neurological: He is alert and oriented to person, place, and time. He has normal strength. Coordination normal. GCS eye subscore is  4. GCS verbal subscore is 5. GCS motor subscore is 6.  Skin: Skin is warm, dry and intact.  Psychiatric: He has a normal mood and affect.    ED Course  Procedures (including critical care time) Labs Review Labs Reviewed  URINALYSIS, ROUTINE W REFLEX MICROSCOPIC - Abnormal; Notable for the following:    Color, Urine RED (*)    APPearance CLOUDY (*)    Hgb urine dipstick LARGE (*)    Protein, ur >300 (*)    Leukocytes, UA SMALL (*)    All other components within normal limits  BASIC METABOLIC PANEL - Abnormal; Notable for the following:    GFR calc non Af Amer 74 (*)     GFR calc Af Amer 86 (*)    All other components within normal limits  CBC WITH DIFFERENTIAL/PLATELET - Abnormal; Notable for the following:    RBC 3.69 (*)    Hemoglobin 12.5 (*)    HCT 35.6 (*)    All other components within normal limits  URINE CULTURE  URINE MICROSCOPIC-ADD ON  PROTIME-INR    Imaging Review No results found.   EKG Interpretation None      MDM   Final diagnoses:  Hematuria  Foley catheter problem, initial encounter   At this time final disposition is pending urology consultation. The patient is otherwise stable without acute urinary retention or anemia.    Charlesetta Shanks, MD 02/09/15 1435

## 2015-02-05 NOTE — ED Notes (Signed)
Patient waiting for leg bag to refill to assess bleeding.

## 2015-02-05 NOTE — ED Notes (Signed)
MD at the bedside with nurse. Patient's catheter repositioned and irrigated with MD approval. Patient made aware of the plan of care.

## 2015-02-05 NOTE — ED Provider Notes (Signed)
Results for orders placed or performed during the hospital encounter of 02/05/15  U/A (may I&O cath if menses)  Result Value Ref Range   Color, Urine RED (A) YELLOW   APPearance CLOUDY (A) CLEAR   Specific Gravity, Urine 1.016 1.005 - 1.030   pH 6.5 5.0 - 8.0   Glucose, UA NEGATIVE NEGATIVE mg/dL   Hgb urine dipstick LARGE (A) NEGATIVE   Bilirubin Urine NEGATIVE NEGATIVE   Ketones, ur NEGATIVE NEGATIVE mg/dL   Protein, ur >300 (A) NEGATIVE mg/dL   Urobilinogen, UA 0.2 0.0 - 1.0 mg/dL   Nitrite NEGATIVE NEGATIVE   Leukocytes, UA SMALL (A) NEGATIVE  Urine microscopic-add on  Result Value Ref Range   Squamous Epithelial / LPF RARE RARE   WBC, UA 7-10 <3 WBC/hpf   RBC / HPF TOO NUMEROUS TO COUNT <3 RBC/hpf  Basic metabolic panel  Result Value Ref Range   Sodium 139 135 - 145 mmol/L   Potassium 4.3 3.5 - 5.1 mmol/L   Chloride 102 96 - 112 mmol/L   CO2 26 19 - 32 mmol/L   Glucose, Bld 86 70 - 99 mg/dL   BUN 9 6 - 23 mg/dL   Creatinine, Ser 1.05 0.50 - 1.35 mg/dL   Calcium 9.9 8.4 - 10.5 mg/dL   GFR calc non Af Amer 74 (L) >90 mL/min   GFR calc Af Amer 86 (L) >90 mL/min   Anion gap 11 5 - 15  CBC with Differential  Result Value Ref Range   WBC 5.4 4.0 - 10.5 K/uL   RBC 3.69 (L) 4.22 - 5.81 MIL/uL   Hemoglobin 12.5 (L) 13.0 - 17.0 g/dL   HCT 35.6 (L) 39.0 - 52.0 %   MCV 96.5 78.0 - 100.0 fL   MCH 33.9 26.0 - 34.0 pg   MCHC 35.1 30.0 - 36.0 g/dL   RDW 13.1 11.5 - 15.5 %   Platelets 236 150 - 400 K/uL   Neutrophils Relative % 60 43 - 77 %   Neutro Abs 3.3 1.7 - 7.7 K/uL   Lymphocytes Relative 30 12 - 46 %   Lymphs Abs 1.6 0.7 - 4.0 K/uL   Monocytes Relative 8 3 - 12 %   Monocytes Absolute 0.4 0.1 - 1.0 K/uL   Eosinophils Relative 2 0 - 5 %   Eosinophils Absolute 0.1 0.0 - 0.7 K/uL   Basophils Relative 0 0 - 1 %   Basophils Absolute 0.0 0.0 - 0.1 K/uL  Protime-INR  Result Value Ref Range   Prothrombin Time 13.9 11.6 - 15.2 seconds   INR 1.06 0.00 - 1.49    Patient  with Foley catheter placed soft very [facility]. Patient has prostate removed several years ago. Then was having flow issues. Actually more of incontinence had a procedure done recently to help correct that. Following that procedure developed urinary retention and catheter was inserted. Patient with discomfort in that area starting at 2 in the morning. And then lots of blood leaking around the catheter and into the leg bag. Clinically seem to cystic catheter may have gotten pulled. Balloon was deflated catheter was pushed back and reinflated and it fit snugly. Patient's discomfort improved with doing this. Catheter was then irrigated and we were getting crit amounts of clear fluid out. Now back up. The urine in the leg bag is starting to show evidence of clearing. There still some expression of old clot around the catheter but no fresh bleeding. Patient stable for discharge home. Labs showed no  significant problems with hemoglobin and hematocrit and also no evidence of any renal function problems. Urine consistent with the gross hematuria and urine culture sent. Patient has follow-up with the [facility] urology folks on Wednesday of next week.   Fredia Sorrow, MD 02/05/15 [phone]

## 2015-02-05 NOTE — ED Notes (Signed)
Leg Bag in place on patient. Patient made aware to come back if bleeding increases or urine output decreases. Patient verbalized understanding.

## 2015-02-05 NOTE — ED Notes (Signed)
Pt reports having urinary catheter since Friday due to retention. Pt now having blood in urine.

## 2015-02-05 NOTE — ED Notes (Signed)
Patient watching TV. Makes no complaints at this time. Verbalizes no other needs. Patient waiting for MD.

## 2015-02-08 LAB — URINE CULTURE: Colony Count: >=100000

## 2015-02-09 ENCOUNTER — Telehealth (HOSPITAL_BASED_OUTPATIENT_CLINIC_OR_DEPARTMENT_OTHER): Payer: Self-pay | Admitting: Emergency Medicine

## 2015-02-09 NOTE — Telephone Encounter (Signed)
Post ED Visit - Positive Culture Follow-up  Culture report reviewed by antimicrobial stewardship pharmacist: []  Wes Dulaney, Pharm.D., BCPS [x]  Heide Guile, Pharm.D., BCPS []  Alycia Rossetti, Pharm.D., BCPS []  [place], [place].D., BCPS, AAHIVP []  Legrand Como, Pharm.D., BCPS, AAHIVP []  Isac Sarna, Pharm.D., BCPS  Positive Urine culture No treatment Al Corpus PA  Georgina Peer [facility] 02/09/2015, 5:30 PM

## 2016-06-01 ENCOUNTER — Inpatient Hospital Stay (HOSPITAL_COMMUNITY)
Admission: EM | Admit: 2016-06-01 | Discharge: 2016-06-10 | DRG: 337 | Disposition: A | Discharge status: Home Health | Payer: Non-veteran care | Attending: General Surgery | Admitting: General Surgery

## 2016-06-01 ENCOUNTER — Emergency Department (HOSPITAL_COMMUNITY): Payer: Non-veteran care

## 2016-06-01 ENCOUNTER — Encounter (HOSPITAL_COMMUNITY): Payer: Self-pay | Admitting: Emergency Medicine

## 2016-06-01 ENCOUNTER — Other Ambulatory Visit: Payer: Self-pay

## 2016-06-01 DIAGNOSIS — Z8546 Personal history of malignant neoplasm of prostate: Secondary | ICD-10-CM

## 2016-06-01 DIAGNOSIS — R32 Unspecified urinary incontinence: Secondary | ICD-10-CM | POA: Diagnosis present

## 2016-06-01 DIAGNOSIS — N320 Bladder-neck obstruction: Secondary | ICD-10-CM | POA: Diagnosis present

## 2016-06-01 DIAGNOSIS — E876 Hypokalemia: Secondary | ICD-10-CM | POA: Diagnosis present

## 2016-06-01 DIAGNOSIS — K029 Dental caries, unspecified: Secondary | ICD-10-CM | POA: Diagnosis present

## 2016-06-01 DIAGNOSIS — K565 Intestinal adhesions [bands] with obstruction (postprocedural) (postinfection): Principal | ICD-10-CM | POA: Diagnosis present

## 2016-06-01 DIAGNOSIS — Z0189 Encounter for other specified special examinations: Secondary | ICD-10-CM

## 2016-06-01 DIAGNOSIS — F419 Anxiety disorder, unspecified: Secondary | ICD-10-CM | POA: Diagnosis present

## 2016-06-01 DIAGNOSIS — Z87891 Personal history of nicotine dependence: Secondary | ICD-10-CM

## 2016-06-01 DIAGNOSIS — F329 Major depressive disorder, single episode, unspecified: Secondary | ICD-10-CM | POA: Diagnosis present

## 2016-06-01 DIAGNOSIS — Z9079 Acquired absence of other genital organ(s): Secondary | ICD-10-CM

## 2016-06-01 DIAGNOSIS — Z79899 Other long term (current) drug therapy: Secondary | ICD-10-CM

## 2016-06-01 DIAGNOSIS — K5669 Other intestinal obstruction: Secondary | ICD-10-CM | POA: Diagnosis not present

## 2016-06-01 DIAGNOSIS — Z7951 Long term (current) use of inhaled steroids: Secondary | ICD-10-CM

## 2016-06-01 DIAGNOSIS — G40909 Epilepsy, unspecified, not intractable, without status epilepticus: Secondary | ICD-10-CM | POA: Diagnosis present

## 2016-06-01 DIAGNOSIS — K56609 Unspecified intestinal obstruction, unspecified as to partial versus complete obstruction: Secondary | ICD-10-CM

## 2016-06-01 LAB — COMPREHENSIVE METABOLIC PANEL
ALT: 17 U/L (ref 17–63)
AST: 30 U/L (ref 15–41)
Albumin: 4.6 g/dL (ref 3.5–5.0)
Alkaline Phosphatase: 69 U/L (ref 38–126)
Anion gap: 14 (ref 5–15)
BUN: 12 mg/dL (ref 6–20)
CHLORIDE: 108 mmol/L (ref 101–111)
CO2: 18 mmol/L — AB (ref 22–32)
CREATININE: 1.02 mg/dL (ref 0.61–1.24)
Calcium: 10.4 mg/dL — ABNORMAL HIGH (ref 8.9–10.3)
GFR calc Af Amer: >60 mL/min (ref >60)
GFR calc non Af Amer: >60 mL/min (ref >60)
GLUCOSE: 131 mg/dL — AB (ref 65–99)
Potassium: 3.2 mmol/L — ABNORMAL LOW (ref 3.5–5.1)
SODIUM: 140 mmol/L (ref 135–145)
Total Bilirubin: 0.5 mg/dL (ref 0.3–1.2)
Total Protein: 8.5 g/dL — ABNORMAL HIGH (ref 6.5–8.1)

## 2016-06-01 LAB — URINALYSIS, ROUTINE W REFLEX MICROSCOPIC
Bilirubin Urine: NEGATIVE
GLUCOSE, UA: NEGATIVE mg/dL
HGB URINE DIPSTICK: NEGATIVE
Leukocytes, UA: NEGATIVE
Nitrite: NEGATIVE
Protein, ur: 30 mg/dL — AB
Specific Gravity, Urine: 1.029 (ref 1.005–1.030)
pH: 8.5 — ABNORMAL HIGH (ref 5.0–8.0)

## 2016-06-01 LAB — CBC
HCT: 37.6 % — ABNORMAL LOW (ref 39.0–52.0)
Hemoglobin: 11.4 g/dL — ABNORMAL LOW (ref 13.0–17.0)
MCH: 21.8 pg — AB (ref 26.0–34.0)
MCHC: 30.3 g/dL (ref 30.0–36.0)
MCV: 71.9 fL — AB (ref 78.0–100.0)
Platelets: 242 10*3/uL (ref 150–400)
RBC: 5.23 MIL/uL (ref 4.22–5.81)
RDW: 20.1 % — AB (ref 11.5–15.5)
WBC: 12.3 10*3/uL — ABNORMAL HIGH (ref 4.0–10.5)

## 2016-06-01 LAB — LIPASE, BLOOD: Lipase: 18 U/L (ref 11–51)

## 2016-06-01 LAB — URINE MICROSCOPIC-ADD ON

## 2016-06-01 MED ORDER — MORPHINE SULFATE (PF) 2 MG/ML IV SOLN
2.0000 mg | INTRAVENOUS | Status: DC | PRN
  Administered 2016-06-01: 4 mg via INTRAVENOUS
  Administered 2016-06-01: 6 mg via INTRAVENOUS
  Administered 2016-06-02 (×4): 4 mg via INTRAVENOUS
  Administered 2016-06-02: 2 mg via INTRAVENOUS
  Administered 2016-06-02: 4 mg via INTRAVENOUS
  Administered 2016-06-03 – 2016-06-04 (×7): 6 mg via INTRAVENOUS
  Filled 2016-06-01: qty 2
  Filled 2016-06-01 (×2): qty 3
  Filled 2016-06-01: qty 2
  Filled 2016-06-01: qty 3
  Filled 2016-06-01 (×3): qty 2
  Filled 2016-06-01 (×3): qty 3
  Filled 2016-06-01: qty 1
  Filled 2016-06-01 (×3): qty 3

## 2016-06-01 MED ORDER — ONDANSETRON HCL 4 MG/2ML IJ SOLN: 4.0000 mg | Freq: Four times a day (QID) | INTRAMUSCULAR | Status: DC | PRN

## 2016-06-01 MED ORDER — MORPHINE SULFATE (PF) 4 MG/ML IV SOLN
4.0000 mg | Freq: Once | INTRAVENOUS | Status: AC
  Administered 2016-06-01: 4 mg via INTRAVENOUS
  Filled 2016-06-01: qty 1

## 2016-06-01 MED ORDER — IOPAMIDOL (ISOVUE-300) INJECTION 61%
INTRAVENOUS | Status: AC
  Administered 2016-06-01: 100 mL
  Filled 2016-06-01: qty 100

## 2016-06-01 MED ORDER — HYDROMORPHONE HCL 1 MG/ML IJ SOLN
1.0000 mg | Freq: Once | INTRAMUSCULAR | Status: AC
  Administered 2016-06-01: 1 mg via INTRAVENOUS
  Filled 2016-06-01: qty 1

## 2016-06-01 MED ORDER — SODIUM CHLORIDE 0.9 % IV BOLUS (SEPSIS)
1000.0000 mL | Freq: Once | INTRAVENOUS | Status: AC
  Administered 2016-06-01: 1000 mL via INTRAVENOUS

## 2016-06-01 MED ORDER — ONDANSETRON 4 MG PO TBDP: 4.0000 mg | ORAL_TABLET | Freq: Four times a day (QID) | ORAL | Status: DC | PRN

## 2016-06-01 MED ORDER — ONDANSETRON HCL 4 MG/2ML IJ SOLN
4.0000 mg | Freq: Once | INTRAMUSCULAR | Status: AC
  Administered 2016-06-01: 4 mg via INTRAVENOUS
  Filled 2016-06-01: qty 2

## 2016-06-01 NOTE — ED Notes (Signed)
Attempted IV. Unable to access  

## 2016-06-01 NOTE — ED Provider Notes (Signed)
CSN: [id number]     Arrival date & time 06/01/16  1151 History   First MD Initiated Contact with Patient 06/01/16 1218     Chief Complaint  Patient presents with  . Abdominal Pain  . Nausea  . Vomiting     (Consider location/radiation/quality/duration/timing/severity/associated sxs/prior Treatment) HPI   Patient is a 65 year old male with history of prostate cancer s/p prostectomy, bladder neck surgery 2 months ago who presents the ED with sudden onset lower abdominal pain since 5 AM this morning. Pain waxes and wanes, from throbbing to sharp, nonradiating, constant, 10/10. Associated vomiting, left-sided flank pain, and subjective fever. BM this morning small amt of hard stools. No increased flatulence. Patient denies dysuria, penile discharge, penile swelling, testicular swelling, blood in the stool, hematemesis. Patient is incontinent of urine.  Past Medical History  Diagnosis Date  . Prostate cancer ([place])   . Anxiety   . Depression   . Sleep deprivation   . Seizures [facility])    Past Surgical History  Procedure Laterality Date  . Prostate surgery     No family history on file. Social History  Substance Use Topics  . Smoking status: Former Research scientist (life sciences)  . Smokeless tobacco: None  . Alcohol Use: 3.6 oz/week    6 Cans of beer per week     Comment: PT trying to quit drinking - drinks a 6 pack/week    Review of Systems  Constitutional: Positive for fever and chills.  Respiratory: Negative for shortness of breath.   Cardiovascular: Negative for chest pain.  Gastrointestinal: Positive for nausea, vomiting, abdominal pain and abdominal distention. Negative for diarrhea, blood in stool and rectal pain.  Genitourinary: Positive for flank pain. Negative for dysuria, hematuria, discharge, penile swelling, scrotal swelling and testicular pain.  Musculoskeletal: Negative for back pain and neck pain.  Skin: Negative for rash.  Neurological: Negative for dizziness, syncope, weakness and  headaches.  Psychiatric/Behavioral: Negative for confusion.  All other systems reviewed and are negative.     Allergies  Review of patient's allergies indicates no known allergies.  Home Medications   Prior to Admission medications   Medication Sig Start Date End Date Taking? Authorizing Provider  albuterol (PROVENTIL HFA;VENTOLIN HFA) 108 (90 BASE) MCG/ACT inhaler Inhale 2 puffs into the lungs 4 (four) times daily.   Yes Historical Provider, MD  Cholecalciferol (VITAMIN D3) 2000 UNITS TABS Take 2,000 Units by mouth daily.   Yes Historical Provider, MD  oxybutynin (DITROPAN) 5 MG tablet Take 5 mg by mouth 4 (four) times daily.    Historical Provider, MD  PARoxetine (PAXIL) 40 MG tablet Take 40 mg by mouth every morning.    Historical Provider, MD  senna-docusate (SENOKOT-S) 8.6-50 MG per tablet Take 2 tablets by mouth 2 (two) times daily as needed (for constipation).    Historical Provider, MD  traZODone (DESYREL) 150 MG tablet Take 300 mg by mouth at bedtime as needed for sleep.    Historical Provider, MD   BP 141/100 mmHg  Pulse 70  Temp(Src) 98.8 F (37.1 C) (Oral)  Resp 20  SpO2 99% Physical Exam  Constitutional: He appears well-developed and well-nourished. No distress.  HENT:  Head: Normocephalic and atraumatic.  Eyes: Conjunctivae are normal.  Neck: Normal range of motion.  Cardiovascular: Normal rate, regular rhythm and normal heart sounds.  Exam reveals no gallop and no friction rub.   No murmur heard. Pulmonary/Chest: Effort normal and breath sounds normal. No respiratory distress. He has no wheezes. He has no rales.  Abdominal: Normal appearance and bowel sounds are normal. He exhibits no distension. There is tenderness in the periumbilical area and suprapubic area. There is no rigidity, no rebound, no guarding, no CVA tenderness, no tenderness at McBurney's point and negative Murphy's sign.  Genitourinary: Testes normal and penis normal. Right testis shows no swelling  and no tenderness. Left testis shows no swelling and no tenderness. Uncircumcised. No phimosis, paraphimosis, penile erythema or penile tenderness. No discharge found.  Neurological: He is alert. Coordination normal.  Skin: He is not diaphoretic.  Nursing note and vitals reviewed.   ED Course  Procedures (including critical care time) Labs Review Labs Reviewed  COMPREHENSIVE METABOLIC PANEL - Abnormal; Notable for the following:    Potassium 3.2 (*)    CO2 18 (*)    Glucose, Bld 131 (*)    Calcium 10.4 (*)    Total Protein 8.5 (*)    All other components within normal limits  CBC - Abnormal; Notable for the following:    WBC 12.3 (*)    Hemoglobin 11.4 (*)    HCT 37.6 (*)    MCV 71.9 (*)    MCH 21.8 (*)    RDW 20.1 (*)    All other components within normal limits  URINALYSIS, ROUTINE W REFLEX MICROSCOPIC (NOT AT [facility]) - Abnormal; Notable for the following:    pH 8.5 (*)    Ketones, ur >80 (*)    Protein, ur 30 (*)    All other components within normal limits  URINE MICROSCOPIC-ADD ON - Abnormal; Notable for the following:    Squamous Epithelial / LPF 6-30 (*)    Bacteria, UA FEW (*)    All other components within normal limits  LIPASE, BLOOD    Imaging Review Ct Abdomen Pelvis W Contrast  06/01/2016  CLINICAL DATA:  Lower abdominal pain with vomiting since this morning. EXAM: CT ABDOMEN AND PELVIS WITH CONTRAST TECHNIQUE: Multidetector CT imaging of the abdomen and pelvis was performed using the standard protocol following bolus administration of intravenous contrast. CONTRAST:  139mL ISOVUE-300 IOPAMIDOL (ISOVUE-300) INJECTION 61% COMPARISON:  None. FINDINGS: Lung bases: Mild dependent subsegmental atelectasis. Otherwise clear. Heart normal size. Moderate coronary artery calcifications. Liver, spleen, gallbladder, pancreas, adrenal glands:  Normal. Kidneys, ureters, bladder: Small low-attenuation area in the lower pole the right kidney, nonspecific. Kidneys otherwise  unremarkable. No stones or hydronephrosis. Normal ureters. Bladder is unremarkable. Lymph nodes:  No pathologically enlarged lymph nodes. Vascular: Mild atherosclerotic calcifications along the infrarenal abdominal aorta and iliac vessels. Ascites:  None. Gastrointestinal: There is dilation of small bowel, maximum 3.6 cm, with an abrupt transition to decompressed bowel in the central upper pelvis. Remainder of the ileum is decompressed. The colon is mostly decompressed. There is no bowel wall thickening. There is mild hazy inflammatory change in small bowel mesenteries of the anterior lower abdomen. Normal stomach. Normal appendix visualized. Musculoskeletal:  Unremarkable. IMPRESSION: 1. Partial small bowel obstruction, moderate to high-grade. Transition point is in the central upper pelvis, likely from adhesions. 2. No other acute findings. Electronically Signed   By: Lajean Manes M.D.   On: 06/01/2016 15:11   I have personally reviewed and evaluated these images and lab results as part of my medical decision-making.   EKG Interpretation   Date/Time:  Friday June 01 2016 12:00:46 EDT Ventricular Rate:  61 PR Interval:    QRS Duration: 92 QT Interval:  431 QTC Calculation: 435 R Axis:   51 Text Interpretation:  Normal sinus rhythm Confirmed by  Hazle Coca [phone])  on 06/01/2016 12:13:06 PM      MDM   Final diagnoses:  Small bowel obstruction [facility])   Patient with acute onset abdominal pain. CT scan revealed partial small bowel obstruction. Patient with mild leukocytosis. Patient is hypokalemic. Patient currently vomiting cannot treat PO at this time. Will consult general surgery for admission for small bowel obstruction  I spoke with general surgery who will admit the patient for further evaluation and treatment. NG tube placed while in the ED. Thank you general surgery for your attention and further care of this patient.   Kalman Drape, Justice 06/01/16 1631  Quintella Reichert, MD 06/02/16  [phone]

## 2016-06-01 NOTE — ED Notes (Addendum)
Pt in from home via [facility] with c/o sharp upper abd pain since 0500 and vomitting x1 at 1115. Pt woke up w/sharp pain, took "big swallow" of Pepto for relief. Pt vomited in ambulance on way over. Contents were pink per EMS, but no blood seen. Pt reports drinking 1 can of beer yesterday afternoon, reportedly trying to stop..now drinks 6 pack/week.

## 2016-06-01 NOTE — ED Notes (Signed)
PA at the bedside.

## 2016-06-01 NOTE — H&P (Signed)
Roger Savage is an 65 y.o. male.   Chief Complaint: SBO HPI: Sierra was woken from sleep this morning with severe abdominal pain. He tried taking Alka-Seltzer and PeptoBismal without relief. He had nausea. When he didn't get any relief he called EMS and then vomited on the ride in. He denies any prior episodes of similar symptoms. He has a history of prostate cancer. He thinks treatment of that led to a bladder outlet obstruction and had that operated on at [facility] early last month.  Past Medical History  Diagnosis Date  . Prostate cancer ([place])   . Anxiety   . Depression   . Sleep deprivation   . Seizures [facility])     Past Surgical History  Procedure Laterality Date  . Prostate surgery      No family history on file. Social History:  reports that he has quit smoking. He does not have any smokeless tobacco history on file. He reports that he drinks about 3.6 oz of alcohol per week. He reports that he does not use illicit drugs.  Allergies: No Known Allergies  Results for orders placed or performed during the hospital encounter of 06/01/16 (from the past 48 hour(s))  Lipase, blood     Status: None   Collection Time: 06/01/16 12:49 PM  Result Value Ref Range   Lipase 18 11 - 51 U/L  Comprehensive metabolic panel     Status: Abnormal   Collection Time: 06/01/16 12:49 PM  Result Value Ref Range   Sodium 140 135 - 145 mmol/L   Potassium 3.2 (L) 3.5 - 5.1 mmol/L   Chloride 108 101 - 111 mmol/L   CO2 18 (L) 22 - 32 mmol/L   Glucose, Bld 131 (H) 65 - 99 mg/dL   BUN 12 6 - 20 mg/dL   Creatinine, Ser 1.02 0.61 - 1.24 mg/dL   Calcium 10.4 (H) 8.9 - 10.3 mg/dL   Total Protein 8.5 (H) 6.5 - 8.1 g/dL   Albumin 4.6 3.5 - 5.0 g/dL   AST 30 15 - 41 U/L   ALT 17 17 - 63 U/L   Alkaline Phosphatase 69 38 - 126 U/L   Total Bilirubin 0.5 0.3 - 1.2 mg/dL   GFR calc non Af Amer >60 >60 mL/min   GFR calc Af Amer >60 >60 mL/min    Comment: (NOTE) The eGFR has been calculated using the CKD EPI  equation. This calculation has not been validated in all clinical situations. eGFR's persistently <60 mL/min signify possible Chronic Kidney Disease.    Anion gap 14 5 - 15  CBC     Status: Abnormal   Collection Time: 06/01/16 12:49 PM  Result Value Ref Range   WBC 12.3 (H) 4.0 - 10.5 K/uL   RBC 5.23 4.22 - 5.81 MIL/uL   Hemoglobin 11.4 (L) 13.0 - 17.0 g/dL   HCT 37.6 (L) 39.0 - 52.0 %   MCV 71.9 (L) 78.0 - 100.0 fL   MCH 21.8 (L) 26.0 - 34.0 pg   MCHC 30.3 30.0 - 36.0 g/dL   RDW 20.1 (H) 11.5 - 15.5 %   Platelets 242 150 - 400 K/uL  Urinalysis, Routine w reflex microscopic     Status: Abnormal   Collection Time: 06/01/16 12:53 PM  Result Value Ref Range   Color, Urine YELLOW YELLOW   APPearance CLEAR CLEAR   Specific Gravity, Urine 1.029 1.005 - 1.030   pH 8.5 (H) 5.0 - 8.0   Glucose, UA NEGATIVE NEGATIVE mg/dL  Hgb urine dipstick NEGATIVE NEGATIVE   Bilirubin Urine NEGATIVE NEGATIVE   Ketones, ur >80 (A) NEGATIVE mg/dL   Protein, ur 30 (A) NEGATIVE mg/dL   Nitrite NEGATIVE NEGATIVE   Leukocytes, UA NEGATIVE NEGATIVE  Urine microscopic-add on     Status: Abnormal   Collection Time: 06/01/16 12:53 PM  Result Value Ref Range   Squamous Epithelial / LPF 6-30 (A) NONE SEEN   WBC, UA 0-5 0 - 5 WBC/hpf   RBC / HPF 0-5 0 - 5 RBC/hpf   Bacteria, UA FEW (A) NONE SEEN   Urine-Other MUCOUS PRESENT    Ct Abdomen Pelvis W Contrast  06/01/2016  CLINICAL DATA:  Lower abdominal pain with vomiting since this morning. EXAM: CT ABDOMEN AND PELVIS WITH CONTRAST TECHNIQUE: Multidetector CT imaging of the abdomen and pelvis was performed using the standard protocol following bolus administration of intravenous contrast. CONTRAST:  150m ISOVUE-300 IOPAMIDOL (ISOVUE-300) INJECTION 61% COMPARISON:  None. FINDINGS: Lung bases: Mild dependent subsegmental atelectasis. Otherwise clear. Heart normal size. Moderate coronary artery calcifications. Liver, spleen, gallbladder, pancreas, adrenal glands:   Normal. Kidneys, ureters, bladder: Small low-attenuation area in the lower pole the right kidney, nonspecific. Kidneys otherwise unremarkable. No stones or hydronephrosis. Normal ureters. Bladder is unremarkable. Lymph nodes:  No pathologically enlarged lymph nodes. Vascular: Mild atherosclerotic calcifications along the infrarenal abdominal aorta and iliac vessels. Ascites:  None. Gastrointestinal: There is dilation of small bowel, maximum 3.6 cm, with an abrupt transition to decompressed bowel in the central upper pelvis. Remainder of the ileum is decompressed. The colon is mostly decompressed. There is no bowel wall thickening. There is mild hazy inflammatory change in small bowel mesenteries of the anterior lower abdomen. Normal stomach. Normal appendix visualized. Musculoskeletal:  Unremarkable. IMPRESSION: 1. Partial small bowel obstruction, moderate to high-grade. Transition point is in the central upper pelvis, likely from adhesions. 2. No other acute findings. Electronically Signed   By: DLajean ManesM.D.   On: 06/01/2016 15:11    Review of Systems  Gastrointestinal: Positive for nausea, vomiting and abdominal pain.  All other systems reviewed and are negative.   Blood pressure 170/100, pulse 64, temperature 98.8 F (37.1 C), temperature source Oral, resp. rate 25, SpO2 97 %. Physical Exam  Constitutional: He appears well-developed and well-nourished. No distress.  HENT:  Head: Normocephalic.  Eyes: Conjunctivae are normal. Right eye exhibits no discharge. Left eye exhibits no discharge. No scleral icterus.  Neck: Normal range of motion. Neck supple.  Cardiovascular: Normal rate, regular rhythm and normal heart sounds.  Exam reveals no gallop and no friction rub.   No murmur heard. Respiratory: Effort normal and breath sounds normal. No respiratory distress. He has no wheezes. He has no rales.  GI: Bowel sounds are normal. He exhibits distension. There is generalized tenderness.   Musculoskeletal: Normal range of motion.  Neurological: He is alert.  Skin: Skin is warm and dry. He is not diaphoretic.  Psychiatric: He has a normal mood and affect. His behavior is normal.     Assessment/Plan SBO -- NGT, NPO. Will see if we can manage medically. SB protocol to be ordered. Hx/o prostate cancer Bladder outlet obstruction    MLisette Abu PA-C Pager: [phone]/23/2017, 4:08 PM

## 2016-06-02 ENCOUNTER — Inpatient Hospital Stay (HOSPITAL_COMMUNITY): Payer: Non-veteran care

## 2016-06-02 DIAGNOSIS — Z87891 Personal history of nicotine dependence: Secondary | ICD-10-CM | POA: Diagnosis not present

## 2016-06-02 DIAGNOSIS — K565 Intestinal adhesions [bands] with obstruction (postprocedural) (postinfection): Secondary | ICD-10-CM | POA: Diagnosis present

## 2016-06-02 DIAGNOSIS — R32 Unspecified urinary incontinence: Secondary | ICD-10-CM | POA: Diagnosis present

## 2016-06-02 DIAGNOSIS — Z8546 Personal history of malignant neoplasm of prostate: Secondary | ICD-10-CM | POA: Diagnosis not present

## 2016-06-02 DIAGNOSIS — Z7951 Long term (current) use of inhaled steroids: Secondary | ICD-10-CM | POA: Diagnosis not present

## 2016-06-02 DIAGNOSIS — Z9079 Acquired absence of other genital organ(s): Secondary | ICD-10-CM | POA: Diagnosis not present

## 2016-06-02 DIAGNOSIS — F419 Anxiety disorder, unspecified: Secondary | ICD-10-CM | POA: Diagnosis present

## 2016-06-02 DIAGNOSIS — G40909 Epilepsy, unspecified, not intractable, without status epilepticus: Secondary | ICD-10-CM | POA: Diagnosis present

## 2016-06-02 DIAGNOSIS — Z79899 Other long term (current) drug therapy: Secondary | ICD-10-CM | POA: Diagnosis not present

## 2016-06-02 DIAGNOSIS — K029 Dental caries, unspecified: Secondary | ICD-10-CM | POA: Diagnosis present

## 2016-06-02 DIAGNOSIS — E876 Hypokalemia: Secondary | ICD-10-CM | POA: Diagnosis present

## 2016-06-02 DIAGNOSIS — K5669 Other intestinal obstruction: Secondary | ICD-10-CM | POA: Diagnosis present

## 2016-06-02 DIAGNOSIS — N320 Bladder-neck obstruction: Secondary | ICD-10-CM | POA: Diagnosis present

## 2016-06-02 DIAGNOSIS — K56609 Unspecified intestinal obstruction, unspecified as to partial versus complete obstruction: Secondary | ICD-10-CM | POA: Diagnosis present

## 2016-06-02 DIAGNOSIS — F329 Major depressive disorder, single episode, unspecified: Secondary | ICD-10-CM | POA: Diagnosis present

## 2016-06-02 LAB — BASIC METABOLIC PANEL
ANION GAP: 11 (ref 5–15)
Anion gap: 12 (ref 5–15)
BUN: 14 mg/dL (ref 6–20)
BUN: 14 mg/dL (ref 6–20)
CALCIUM: 9.2 mg/dL (ref 8.9–10.3)
CHLORIDE: 104 mmol/L (ref 101–111)
CHLORIDE: 104 mmol/L (ref 101–111)
CO2: 23 mmol/L (ref 22–32)
CO2: 21 mmol/L — ABNORMAL LOW (ref 22–32)
CREATININE: 0.95 mg/dL (ref 0.61–1.24)
Calcium: 9 mg/dL (ref 8.9–10.3)
Creatinine, Ser: 1.05 mg/dL (ref 0.61–1.24)
GFR calc Af Amer: >60 mL/min (ref >60)
GFR calc non Af Amer: >60 mL/min (ref >60)
GFR calc non Af Amer: >60 mL/min (ref >60)
GLUCOSE: 122 mg/dL — AB (ref 65–99)
Glucose, Bld: 120 mg/dL — ABNORMAL HIGH (ref 65–99)
POTASSIUM: 3.2 mmol/L — AB (ref 3.5–5.1)
Potassium: 3.5 mmol/L (ref 3.5–5.1)
SODIUM: 137 mmol/L (ref 135–145)
Sodium: 138 mmol/L (ref 135–145)

## 2016-06-02 LAB — CBC
HCT: 37.2 % — ABNORMAL LOW (ref 39.0–52.0)
HEMATOCRIT: 35.9 % — AB (ref 39.0–52.0)
HEMOGLOBIN: 10.5 g/dL — AB (ref 13.0–17.0)
Hemoglobin: 10.9 g/dL — ABNORMAL LOW (ref 13.0–17.0)
MCH: 21.4 pg — AB (ref 26.0–34.0)
MCH: 21.5 pg — AB (ref 26.0–34.0)
MCHC: 29.2 g/dL — ABNORMAL LOW (ref 30.0–36.0)
MCHC: 29.3 g/dL — AB (ref 30.0–36.0)
MCV: 73.1 fL — ABNORMAL LOW (ref 78.0–100.0)
MCV: 73.4 fL — AB (ref 78.0–100.0)
PLATELETS: 251 10*3/uL (ref 150–400)
Platelets: 257 10*3/uL (ref 150–400)
RBC: 4.91 MIL/uL (ref 4.22–5.81)
RBC: 5.07 MIL/uL (ref 4.22–5.81)
RDW: 20.2 % — ABNORMAL HIGH (ref 11.5–15.5)
RDW: 20.3 % — AB (ref 11.5–15.5)
WBC: 5.9 10*3/uL (ref 4.0–10.5)
WBC: 4.9 10*3/uL (ref 4.0–10.5)

## 2016-06-02 MED ORDER — ALBUTEROL SULFATE (2.5 MG/3ML) 0.083% IN NEBU: 3.0000 mL | INHALATION_SOLUTION | RESPIRATORY_TRACT | Status: DC | PRN

## 2016-06-02 MED ORDER — POTASSIUM CHLORIDE 10 MEQ/100ML IV SOLN
10.0000 meq | INTRAVENOUS | Status: AC
  Administered 2016-06-02 (×5): 10 meq via INTRAVENOUS
  Filled 2016-06-02 (×5): qty 100

## 2016-06-02 MED ORDER — FAMOTIDINE IN NACL 20-0.9 MG/50ML-% IV SOLN
20.0000 mg | Freq: Two times a day (BID) | INTRAVENOUS | Status: DC
  Administered 2016-06-02 – 2016-06-07 (×12): 20 mg via INTRAVENOUS
  Filled 2016-06-02 (×16): qty 50

## 2016-06-02 MED ORDER — TRAZODONE HCL 150 MG PO TABS: 300.0000 mg | ORAL_TABLET | Freq: Every evening | ORAL | Status: DC | PRN

## 2016-06-02 MED ORDER — POTASSIUM CHLORIDE IN NACL 20-0.45 MEQ/L-% IV SOLN
INTRAVENOUS | Status: DC
  Administered 2016-06-02 – 2016-06-07 (×12): via INTRAVENOUS
  Filled 2016-06-02 (×22): qty 1000

## 2016-06-02 MED ORDER — VITAMIN D 1000 UNITS PO TABS
2000.0000 [IU] | ORAL_TABLET | Freq: Every day | ORAL | Status: DC
  Administered 2016-06-02 – 2016-06-03 (×2): 2000 [IU] via ORAL
  Filled 2016-06-02 (×2): qty 2

## 2016-06-02 MED ORDER — DIATRIZOATE MEGLUMINE & SODIUM 66-10 % PO SOLN
90.0000 mL | Freq: Once | ORAL | Status: AC
  Administered 2016-06-02: 90 mL via NASOGASTRIC
  Filled 2016-06-02 (×2): qty 90

## 2016-06-02 MED ORDER — OXYBUTYNIN CHLORIDE 5 MG PO TABS
5.0000 mg | ORAL_TABLET | Freq: Four times a day (QID) | ORAL | Status: DC
  Administered 2016-06-02 – 2016-06-03 (×4): 5 mg via ORAL
  Filled 2016-06-02 (×5): qty 1

## 2016-06-02 MED ORDER — PNEUMOCOCCAL VAC POLYVALENT 25 MCG/0.5ML IJ INJ: 0.5000 mL | INJECTION | INTRAMUSCULAR | Status: DC | PRN

## 2016-06-02 MED ORDER — PAROXETINE HCL 20 MG PO TABS
40.0000 mg | ORAL_TABLET | Freq: Every morning | ORAL | Status: DC
  Administered 2016-06-02 – 2016-06-03 (×2): 40 mg via ORAL
  Filled 2016-06-02 (×3): qty 2

## 2016-06-02 MED ORDER — ENOXAPARIN SODIUM 40 MG/0.4ML ~~LOC~~ SOLN
40.0000 mg | SUBCUTANEOUS | Status: DC
  Administered 2016-06-02 – 2016-06-09 (×7): 40 mg via SUBCUTANEOUS
  Filled 2016-06-02 (×7): qty 0.4

## 2016-06-02 MED ORDER — ALBUTEROL SULFATE (2.5 MG/3ML) 0.083% IN NEBU: 3.0000 mL | INHALATION_SOLUTION | Freq: Four times a day (QID) | RESPIRATORY_TRACT | Status: DC

## 2016-06-02 NOTE — Progress Notes (Signed)
Subjective: Complains of abdominal pain No flatus or stool  Received in NG water-soluble contrast last night according to RN. X-rays this morning shows some small bowel gas, although not very dramatic.  I do not see any contrast on the film. NG drainage 850 over last 24 hours, and the drainage is fairly clear, nonbilious  WBC 5900, improved.  Hemoglobin 10.5.  Potassium 3.2.  CO2 23.  Creatinine 1.2.  Glucose 120.  Objective: Vital signs in last 24 hours: Temp:  [98.7 F (37.1 C)-98.8 F (37.1 C)] 98.7 F (37.1 C) (06/24 0321) Pulse Rate:  [60-126] 83 (06/24 0321) Resp:  [14-30] 20 (06/24 0100) BP: (130-187)/(60-102) 136/81 mmHg (06/24 0321) SpO2:  [91 %-100 %] 100 % (06/24 0321) Weight:  [69.355 kg (152 lb 14.4 oz)] 69.355 kg (152 lb 14.4 oz) (06/24 0230) Last BM Date: 06/01/16  Intake/Output from previous day: 06/23 0701 - 06/24 0700 In: 373.3 [I.V.:263.3; NG/GT:60; IV Piggyback:50] Out: 1200 [Urine:350; Emesis/NG output:850] Intake/Output this shift: Total I/O In: 0  Out: 100 [Urine:100]  General appearance: Alert and cooperative.  Seems uncomfortable. Resp: clear to auscultation bilaterally GI: Somewhat distended.  Slightly tympanitic.  No peritoneal signs.  Actually fairly soft and minimally tender.  No hernias noted.  Trocar sites from robotic prostatectomy noted.  Lab Results:   Recent Labs  06/01/16 1249 06/02/16 0304  WBC 12.3* 5.9  HGB 11.4* 10.5*  HCT 37.6* 35.9*  PLT 242 257   BMET  Recent Labs  06/01/16 1249 06/02/16 0304  NA 140 138  K 3.2* 3.2*  CL 108 104  CO2 18* 23  GLUCOSE 131* 120*  BUN 12 14  CREATININE 1.02 1.05  CALCIUM 10.4* 9.2   PT/INR No results for input(s): LABPROT, INR in the last 72 hours. ABG No results for input(s): PHART, HCO3 in the last 72 hours.  Invalid input(s): PCO2, PO2  Studies/Results: Dg Chest 1 View  06/01/2016  CLINICAL DATA:  Enteric tube placement EXAM: CHEST 1 VIEW COMPARISON:  01/09/2014 chest  radiograph. FINDINGS: Enteric tube enters stomach with the tip not seen on this image. Stable cardiomediastinal silhouette with normal heart size. No pneumothorax. No pleural effusion. Lungs appear clear, with no acute consolidative airspace disease and no pulmonary edema. IMPRESSION: Enteric tube enters stomach with the tip not seen on this image. No active disease in the chest. Electronically Signed   By: Ilona Sorrel M.D.   On: 06/01/2016 17:11   Ct Abdomen Pelvis W Contrast  06/01/2016  CLINICAL DATA:  Lower abdominal pain with vomiting since this morning. EXAM: CT ABDOMEN AND PELVIS WITH CONTRAST TECHNIQUE: Multidetector CT imaging of the abdomen and pelvis was performed using the standard protocol following bolus administration of intravenous contrast. CONTRAST:  152mL ISOVUE-300 IOPAMIDOL (ISOVUE-300) INJECTION 61% COMPARISON:  None. FINDINGS: Lung bases: Mild dependent subsegmental atelectasis. Otherwise clear. Heart normal size. Moderate coronary artery calcifications. Liver, spleen, gallbladder, pancreas, adrenal glands:  Normal. Kidneys, ureters, bladder: Small low-attenuation area in the lower pole the right kidney, nonspecific. Kidneys otherwise unremarkable. No stones or hydronephrosis. Normal ureters. Bladder is unremarkable. Lymph nodes:  No pathologically enlarged lymph nodes. Vascular: Mild atherosclerotic calcifications along the infrarenal abdominal aorta and iliac vessels. Ascites:  None. Gastrointestinal: There is dilation of small bowel, maximum 3.6 cm, with an abrupt transition to decompressed bowel in the central upper pelvis. Remainder of the ileum is decompressed. The colon is mostly decompressed. There is no bowel wall thickening. There is mild hazy inflammatory change in small bowel mesenteries  of the anterior lower abdomen. Normal stomach. Normal appendix visualized. Musculoskeletal:  Unremarkable. IMPRESSION: 1. Partial small bowel obstruction, moderate to high-grade. Transition  point is in the central upper pelvis, likely from adhesions. 2. No other acute findings. Electronically Signed   By: Lajean Manes M.D.   On: 06/01/2016 15:11   Dg Abd Portable 1v-small Bowel Protocol-position Verification  06/02/2016  CLINICAL DATA:  Nasogastric tube placement EXAM: PORTABLE ABDOMEN - 1 VIEW COMPARISON:  CT abdomen dated 06/01/2016. FINDINGS: NG tube appears adequately positioned in the stomach, with nasogastric tube slightly below the level of the gastroesophageal junction. Distended air-fluid small-bowel loops are again seen within the central abdomen, compatible with the small bowel obstruction described on CT report of 06/01/2016. IMPRESSION: Nasogastric tube is positioned in the stomach, however, the proximal side holes are located just below the level of the gastroesophageal junction. Would consider advancing for more optimal radiographic positioning. Evidence of small bowel obstruction, better demonstrated on recent CT abdomen of 06/01/2016. Electronically Signed   By: Franki Cabot M.D.   On: 06/02/2016 07:26    Anti-infectives: Anti-infectives    None      Assessment/Plan:  SBO.  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 I am concerned about his pain, however with the absence of abdominal tenderness or leukocytosis, I do not think there is any compromised bowel. We'll continue to treat conservatively for another 24 hours.  Repeat lab and abdominal films tomorrow.  If his pain persist we may be forced to intervene surgically.  History robotic prostatectomy for prostate cancer.  [place].  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  Details unknown.Marland Kitchen Performed in [facility]    LOS: 0 days    Adin Hector 06/02/2016

## 2016-06-03 ENCOUNTER — Inpatient Hospital Stay (HOSPITAL_COMMUNITY): Payer: Non-veteran care

## 2016-06-03 MED ORDER — PHENOL 1.4 % MT LIQD
1.0000 | OROMUCOSAL | Status: DC | PRN
  Filled 2016-06-03: qty 177

## 2016-06-03 NOTE — Progress Notes (Signed)
Subjective: Still has crampy pain.  No stool or flatus. NG output 700 mL yesterday. Abdominal films still look like SBO.  CT on admission more impressive than plain films, however. WBC 4900.  Potassium 3.5.  Glucose 122.  Creatinine 0.95.  Objective: Vital signs in last 24 hours: Temp:  [98.2 F (36.8 C)-98.5 F (36.9 C)] 98.3 F (36.8 C) (06/25 0627) Pulse Rate:  [63-66] 66 (06/25 0627) Resp:  [19-20] 19 (06/25 0627) BP: (146-171)/(73-85) 146/85 mmHg (06/25 0627) SpO2:  [100 %] 100 % (06/25 0627) Last BM Date: 06/01/16  Intake/Output from previous day: 06/24 0701 - 06/25 0700 In: 2631 [I.V.:2181; IV Piggyback:450] Out: 2000 [Urine:700; Emesis/NG output:1300] Intake/Output this shift: Total I/O In: -  Out: 100 [Urine:100]  General appearance: Alert.  Cooperative.  Mental status normal.  Good insight. Resp: clear to auscultation bilaterally GI: Distended.  Tympanitic in upper abdomen.  Dull to percussion in lower abdomen.  A few bowel sounds.  Not really tender.  Lab Results:   Recent Labs  06/02/16 0304 06/02/16 1111  WBC 5.9 4.9  HGB 10.5* 10.9*  HCT 35.9* 37.2*  PLT 257 251   BMET  Recent Labs  06/02/16 0304 06/02/16 1035  NA 138 137  K 3.2* 3.5  CL 104 104  CO2 23 21*  GLUCOSE 120* 122*  BUN 14 14  CREATININE 1.05 0.95  CALCIUM 9.2 9.0   PT/INR No results for input(s): LABPROT, INR in the last 72 hours. ABG No results for input(s): PHART, HCO3 in the last 72 hours.  Invalid input(s): PCO2, PO2  Studies/Results: Dg Chest 1 View  06/01/2016  CLINICAL DATA:  Enteric tube placement EXAM: CHEST 1 VIEW COMPARISON:  01/09/2014 chest radiograph. FINDINGS: Enteric tube enters stomach with the tip not seen on this image. Stable cardiomediastinal silhouette with normal heart size. No pneumothorax. No pleural effusion. Lungs appear clear, with no acute consolidative airspace disease and no pulmonary edema. IMPRESSION: Enteric tube enters stomach with the  tip not seen on this image. No active disease in the chest. Electronically Signed   By: Ilona Sorrel M.D.   On: 06/01/2016 17:11   Ct Abdomen Pelvis W Contrast  06/01/2016  CLINICAL DATA:  Lower abdominal pain with vomiting since this morning. EXAM: CT ABDOMEN AND PELVIS WITH CONTRAST TECHNIQUE: Multidetector CT imaging of the abdomen and pelvis was performed using the standard protocol following bolus administration of intravenous contrast. CONTRAST:  13mL ISOVUE-300 IOPAMIDOL (ISOVUE-300) INJECTION 61% COMPARISON:  None. FINDINGS: Lung bases: Mild dependent subsegmental atelectasis. Otherwise clear. Heart normal size. Moderate coronary artery calcifications. Liver, spleen, gallbladder, pancreas, adrenal glands:  Normal. Kidneys, ureters, bladder: Small low-attenuation area in the lower pole the right kidney, nonspecific. Kidneys otherwise unremarkable. No stones or hydronephrosis. Normal ureters. Bladder is unremarkable. Lymph nodes:  No pathologically enlarged lymph nodes. Vascular: Mild atherosclerotic calcifications along the infrarenal abdominal aorta and iliac vessels. Ascites:  None. Gastrointestinal: There is dilation of small bowel, maximum 3.6 cm, with an abrupt transition to decompressed bowel in the central upper pelvis. Remainder of the ileum is decompressed. The colon is mostly decompressed. There is no bowel wall thickening. There is mild hazy inflammatory change in small bowel mesenteries of the anterior lower abdomen. Normal stomach. Normal appendix visualized. Musculoskeletal:  Unremarkable. IMPRESSION: 1. Partial small bowel obstruction, moderate to high-grade. Transition point is in the central upper pelvis, likely from adhesions. 2. No other acute findings. Electronically Signed   By: Lajean Manes M.D.   On: 06/01/2016 15:11  Dg Abd Portable 1v-small Bowel Obstruction Protocol-initial, 8 Hr Delay  06/02/2016  CLINICAL DATA:  8 hour delay image, small bowel obstruction EXAM: PORTABLE  ABDOMEN - 1 VIEW COMPARISON:  None. FINDINGS: There is some diluted contrast material within stomach and proximal small bowel. Persistent moderate gaseous distended small bowel loop in mid abdomen highly suspicious for small bowel obstruction. NG tube has been removed. IMPRESSION: Persistent moderate gaseous distended small bowel loop in mid abdomen highly suspicious for small bowel obstruction. NG tube has been removed. Electronically Signed   By: Lahoma Crocker M.D.   On: 06/02/2016 13:45   Dg Abd Portable 1v-small Bowel Protocol-position Verification  06/02/2016  CLINICAL DATA:  Nasogastric tube placement EXAM: PORTABLE ABDOMEN - 1 VIEW COMPARISON:  CT abdomen dated 06/01/2016. FINDINGS: NG tube appears adequately positioned in the stomach, with nasogastric tube slightly below the level of the gastroesophageal junction. Distended air-fluid small-bowel loops are again seen within the central abdomen, compatible with the small bowel obstruction described on CT report of 06/01/2016. IMPRESSION: Nasogastric tube is positioned in the stomach, however, the proximal side holes are located just below the level of the gastroesophageal junction. Would consider advancing for more optimal radiographic positioning. Evidence of small bowel obstruction, better demonstrated on recent CT abdomen of 06/01/2016. Electronically Signed   By: Franki Cabot M.D.   On: 06/02/2016 07:26    Anti-infectives: Anti-infectives    None      Assessment/Plan:   SBO. 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 I am concerned about his pain, however with the absence of abdominal tenderness or leukocytosis, I do not think there is any compromised bowel. My judgment is that he will not open up and will need laparotomy. We'll discuss with Dr. Hulen Skains who is the on-call surgeon today. I told the patient that he may need laparotomy soon and he is perfectly willing to do that.  History robotic prostatectomy for prostate cancer. [place].  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 Details unknown.Marland Kitchen Performed in [facility] Incontinent and wears diaper   LOS: 1 day    Adin Hector 06/03/2016

## 2016-06-04 ENCOUNTER — Inpatient Hospital Stay (HOSPITAL_COMMUNITY): Payer: Non-veteran care | Admitting: Anesthesiology

## 2016-06-04 ENCOUNTER — Encounter (HOSPITAL_COMMUNITY): Payer: Self-pay | Admitting: Certified Registered Nurse Anesthetist

## 2016-06-04 ENCOUNTER — Encounter (HOSPITAL_COMMUNITY): Admission: EM | Disposition: A | Discharge status: Home Health | Payer: Self-pay | Source: Home / Self Care

## 2016-06-04 HISTORY — PX: LAPAROTOMY: SHX154

## 2016-06-04 SURGERY — LAPAROTOMY, EXPLORATORY
Anesthesia: General | Site: Abdomen

## 2016-06-04 MED ORDER — ONDANSETRON HCL 4 MG/2ML IJ SOLN
INTRAMUSCULAR | Status: AC
  Filled 2016-06-04: qty 2

## 2016-06-04 MED ORDER — HYDROMORPHONE HCL 1 MG/ML IJ SOLN
INTRAMUSCULAR | Status: AC
  Filled 2016-06-04: qty 1

## 2016-06-04 MED ORDER — SUGAMMADEX SODIUM 200 MG/2ML IV SOLN
INTRAVENOUS | Status: DC | PRN
  Administered 2016-06-04: 200 mg via INTRAVENOUS

## 2016-06-04 MED ORDER — LIDOCAINE 2% (20 MG/ML) 5 ML SYRINGE
INTRAMUSCULAR | Status: AC
  Filled 2016-06-04: qty 5

## 2016-06-04 MED ORDER — DEXAMETHASONE SODIUM PHOSPHATE 10 MG/ML IJ SOLN
INTRAMUSCULAR | Status: AC
  Filled 2016-06-04: qty 1

## 2016-06-04 MED ORDER — ROCURONIUM BROMIDE 50 MG/5ML IV SOLN
INTRAVENOUS | Status: AC
  Filled 2016-06-04: qty 1

## 2016-06-04 MED ORDER — PROPOFOL 10 MG/ML IV BOLUS
INTRAVENOUS | Status: AC
  Filled 2016-06-04: qty 20

## 2016-06-04 MED ORDER — CEFAZOLIN SODIUM-DEXTROSE 2-3 GM-% IV SOLR
INTRAVENOUS | Status: DC | PRN
  Administered 2016-06-04: 2 g via INTRAVENOUS

## 2016-06-04 MED ORDER — LIDOCAINE HCL (CARDIAC) 20 MG/ML IV SOLN
INTRAVENOUS | Status: DC | PRN
  Administered 2016-06-04: 80 mg via INTRAVENOUS

## 2016-06-04 MED ORDER — CEFAZOLIN SODIUM 1 G IJ SOLR
INTRAMUSCULAR | Status: AC
  Filled 2016-06-04: qty 20

## 2016-06-04 MED ORDER — SUCCINYLCHOLINE CHLORIDE 20 MG/ML IJ SOLN
INTRAMUSCULAR | Status: DC | PRN
  Administered 2016-06-04: 180 mg via INTRAVENOUS

## 2016-06-04 MED ORDER — LACTATED RINGERS IV SOLN: INTRAVENOUS | Status: DC

## 2016-06-04 MED ORDER — ROCURONIUM BROMIDE 100 MG/10ML IV SOLN
INTRAVENOUS | Status: DC | PRN
  Administered 2016-06-04: 20 mg via INTRAVENOUS
  Administered 2016-06-04: 50 mg via INTRAVENOUS

## 2016-06-04 MED ORDER — MORPHINE SULFATE (PF) 2 MG/ML IV SOLN
1.0000 mg | INTRAVENOUS | Status: DC | PRN
  Administered 2016-06-04 – 2016-06-05 (×10): 4 mg via INTRAVENOUS
  Filled 2016-06-04 (×11): qty 2

## 2016-06-04 MED ORDER — FENTANYL CITRATE (PF) 100 MCG/2ML IJ SOLN
INTRAMUSCULAR | Status: DC | PRN
Start: 2016-06-04 — End: 2016-06-04
  Administered 2016-06-04: 100 ug via INTRAVENOUS
  Administered 2016-06-04: 50 ug via INTRAVENOUS
  Administered 2016-06-04: 100 ug via INTRAVENOUS
  Administered 2016-06-04 (×3): 50 ug via INTRAVENOUS

## 2016-06-04 MED ORDER — SUCCINYLCHOLINE CHLORIDE 200 MG/10ML IV SOSY
PREFILLED_SYRINGE | INTRAVENOUS | Status: AC
  Filled 2016-06-04: qty 10

## 2016-06-04 MED ORDER — HYDROMORPHONE HCL 1 MG/ML IJ SOLN
0.2500 mg | INTRAMUSCULAR | Status: DC | PRN
  Administered 2016-06-04 (×4): 0.5 mg via INTRAVENOUS

## 2016-06-04 MED ORDER — MIDAZOLAM HCL 2 MG/2ML IJ SOLN
INTRAMUSCULAR | Status: AC
  Filled 2016-06-04: qty 2

## 2016-06-04 MED ORDER — ONDANSETRON HCL 4 MG/2ML IJ SOLN
INTRAMUSCULAR | Status: DC | PRN
  Administered 2016-06-04: 4 mg via INTRAVENOUS

## 2016-06-04 MED ORDER — FENTANYL CITRATE (PF) 250 MCG/5ML IJ SOLN
INTRAMUSCULAR | Status: AC
  Filled 2016-06-04: qty 5

## 2016-06-04 MED ORDER — PROPOFOL 10 MG/ML IV BOLUS
INTRAVENOUS | Status: DC | PRN
  Administered 2016-06-04: 200 mg via INTRAVENOUS

## 2016-06-04 MED ORDER — 0.9 % SODIUM CHLORIDE (POUR BTL) OPTIME
TOPICAL | Status: DC | PRN
  Administered 2016-06-04 (×2): 1000 mL

## 2016-06-04 MED ORDER — LACTATED RINGERS IV SOLN
INTRAVENOUS | Status: DC
Start: 2016-06-04 — End: 2016-06-07
  Administered 2016-06-04 (×3): via INTRAVENOUS

## 2016-06-04 MED ORDER — DEXAMETHASONE SODIUM PHOSPHATE 10 MG/ML IJ SOLN
INTRAMUSCULAR | Status: DC | PRN
  Administered 2016-06-04: 10 mg via INTRAVENOUS

## 2016-06-04 MED ORDER — LIDOCAINE HCL 2 % EX GEL
CUTANEOUS | Status: DC | PRN
  Administered 2016-06-04: 1 via URETHRAL

## 2016-06-04 MED ORDER — MIDAZOLAM HCL 5 MG/5ML IJ SOLN
INTRAMUSCULAR | Status: DC | PRN
  Administered 2016-06-04: 2 mg via INTRAVENOUS

## 2016-06-04 SURGICAL SUPPLY — 38 items
BLADE SURG ROTATE 9660 (MISCELLANEOUS) IMPLANT
CANISTER SUCTION 2500CC (MISCELLANEOUS) IMPLANT
CATH COUDE FOLEY 2W 5CC 16FR (CATHETERS) ×3 IMPLANT
CHLORAPREP W/TINT 26ML (MISCELLANEOUS) ×3 IMPLANT
COVER SURGICAL LIGHT HANDLE (MISCELLANEOUS) ×3 IMPLANT
DRAPE LAPAROSCOPIC ABDOMINAL (DRAPES) ×3 IMPLANT
DRAPE WARM FLUID 44X44 (DRAPE) ×3 IMPLANT
DRSG OPSITE POSTOP 4X10 (GAUZE/BANDAGES/DRESSINGS) ×3 IMPLANT
DRSG OPSITE POSTOP 4X8 (GAUZE/BANDAGES/DRESSINGS) IMPLANT
ELECT BLADE 6.5 EXT (BLADE) IMPLANT
ELECT CAUTERY BLADE 6.4 (BLADE) ×3 IMPLANT
ELECT REM PT RETURN 9FT ADLT (ELECTROSURGICAL) ×3
ELECTRODE REM PT RTRN 9FT ADLT (ELECTROSURGICAL) ×1 IMPLANT
GLOVE BIO SURGEON STRL SZ7.5 (GLOVE) ×6 IMPLANT
GLOVE BIOGEL PI IND STRL 7.5 (GLOVE) ×1 IMPLANT
GLOVE BIOGEL PI INDICATOR 7.5 (GLOVE) ×2
GLOVE ECLIPSE 7.5 STRL STRAW (GLOVE) ×3 IMPLANT
GOWN STRL REUS W/ TWL LRG LVL3 (GOWN DISPOSABLE) ×3 IMPLANT
GOWN STRL REUS W/TWL LRG LVL3 (GOWN DISPOSABLE) ×9
KIT BASIN OR (CUSTOM PROCEDURE TRAY) ×3 IMPLANT
KIT ROOM TURNOVER OR (KITS) ×3 IMPLANT
LIGASURE IMPACT 36 18CM CVD LR (INSTRUMENTS) IMPLANT
NS IRRIG 1000ML POUR BTL (IV SOLUTION) ×6 IMPLANT
PACK GENERAL/GYN (CUSTOM PROCEDURE TRAY) ×3 IMPLANT
PAD ARMBOARD 7.5X6 YLW CONV (MISCELLANEOUS) ×3 IMPLANT
SPECIMEN JAR LARGE (MISCELLANEOUS) IMPLANT
SPONGE LAP 18X18 X RAY DECT (DISPOSABLE) IMPLANT
STAPLER VISISTAT 35W (STAPLE) ×3 IMPLANT
SUCTION POOLE TIP (SUCTIONS) ×3 IMPLANT
SUT PDS AB 1 TP1 96 (SUTURE) ×6 IMPLANT
SUT SILK 2 0 SH CR/8 (SUTURE) ×6 IMPLANT
SUT SILK 2 0 TIES 10X30 (SUTURE) ×3 IMPLANT
SUT SILK 3 0 SH CR/8 (SUTURE) ×3 IMPLANT
SUT SILK 3 0 TIES 10X30 (SUTURE) ×3 IMPLANT
SUT VIC AB 3-0 SH 18 (SUTURE) ×3 IMPLANT
TOWEL OR 17X26 10 PK STRL BLUE (TOWEL DISPOSABLE) ×3 IMPLANT
TRAY FOLEY CATH 16FRSI W/METER (SET/KITS/TRAYS/PACK) ×3 IMPLANT
YANKAUER SUCT BULB TIP NO VENT (SUCTIONS) IMPLANT

## 2016-06-04 NOTE — Transfer of Care (Signed)
Immediate Anesthesia Transfer of Care Note  Patient: Roger Savage  Procedure(s) Performed: Procedure(s): EXPLORATORY LAPAROTOMY (N/A)  Patient Location: PACU  Anesthesia Type:General  Level of Consciousness: awake, alert , oriented, patient cooperative and responds to stimulation  Airway & Oxygen Therapy: Patient Spontanous Breathing and Patient connected to nasal cannula oxygen  Post-op Assessment: Report given to RN and Post -op Vital signs reviewed and stable  Post vital signs: Reviewed and stable  Last Vitals:  Filed Vitals:   06/03/16 2202 06/04/16 0512  BP: 146/80 138/72  Pulse: 72 69  Temp: 37.1 C 37.2 C  Resp: 18 19    Last Pain:  Filed Vitals:   06/04/16 1048  PainSc: Asleep      Patients Stated Pain Goal: 2 ([id number])  Complications: No apparent anesthesia complications

## 2016-06-04 NOTE — Anesthesia Postprocedure Evaluation (Signed)
Anesthesia Post Note  Patient: Roger Savage  Procedure(s) Performed: Procedure(s) (LRB): EXPLORATORY LAPAROTOMY (N/A)  Patient location during evaluation: PACU Anesthesia Type: General Level of consciousness: awake and alert Pain management: pain level controlled Vital Signs Assessment: post-procedure vital signs reviewed and stable Respiratory status: spontaneous breathing, nonlabored ventilation, respiratory function stable and patient connected to nasal cannula oxygen Cardiovascular status: blood pressure returned to baseline and stable Postop Assessment: no signs of nausea or vomiting Anesthetic complications: no    Last Vitals:  Filed Vitals:   06/04/16 1130 06/04/16 1146  BP: 177/90 182/89  Pulse: 81 85  Temp:  36.7 C  Resp: 20 24    Last Pain:  Filed Vitals:   06/04/16 1147  PainSc: 8                  Renetta Suman L

## 2016-06-04 NOTE — Op Note (Addendum)
06/01/2016 - 06/04/2016  10:41 AM  PATIENT:  Roger Savage  65 y.o. male  PRE-OPERATIVE DIAGNOSIS:  Bowel obstruction   POST-OPERATIVE DIAGNOSIS:  Bowel obstruction   PROCEDURE:  Procedure(s): EXPLORATORY LAPAROTOMY (N/A) Lysis of adhesions  SURGEON:  Surgeon(s) and Role:    * Jovita Kussmaul, MD - Primary  PHYSICIAN ASSISTANT:   ASSISTANTS: Melina Modena, PA   ANESTHESIA:   general  EBL:  Total I/O In: 1300 [I.V.:1300] Out: 90 [Urine:40; Blood:50]  BLOOD ADMINISTERED:none  DRAINS: none   LOCAL MEDICATIONS USED:  NONE  SPECIMEN:  No Specimen  DISPOSITION OF SPECIMEN:  N/A  COUNTS:  YES  TOURNIQUET:  * No tourniquets in log *  DICTATION: .Dragon Dictation   After informed consent was obtained the patient was taken to the operating room and placed in the supine position on the operating room table. After adequate induction of general anesthesia the patient's abdomen was prepped with ChloraPrep, allowed to dry, and draped in usual sterile manner. An appropriate timeout was performed.  A midline incision was then made with a 10 blade knife. The incision was carried through the skin and subcutaneous tissue sharply with electrocautery until the linea alba was identified.  The linea alba was incised with electrocautery. The preperitoneal space was then probed bluntly with a hemostat until the peritoneum was opened and access was gained to the abdominal cavity. The rest of the incision was opened under direct vision. The small bowel was run from the ligament of Treitz to the ileocecal valve. There was a significant bandlike adhesion of the distal small bowel causing an obstruction.This adhesive band was lysed sharply with Metzenbaum scissors. The site of the adhesive band was imbricated with a 3-0 silk Lembert stitch. There were 2 other areas of adhesion in the pelvis causing significant kinking of the small bowel. These were also lysed by sharp dissection with the Metzenbaum scissors.  One of these areas had a serosal tear once it was freed. This area was also imbricated using interrupted 2-0 silk Lembert stitches.  Once this was accomplished the small bowel appeared to be completely free. There were no other abnormalities on general inspection of the abdomen. The NG tube was confirmed in the distal stomach. At this point the abdomen was irrigated with copious amount of saline. The  Abdominal wall fascia was then closed with 2 running #1 double-stranded loop PDS sutures. The subcutaneous tissue was irrigated with copious amounts of saline. The skin was closed with staples. Sterile dressings were applied. The patient tolerated the procedure well. At the end of the case all needle sponge and instrument counts were correct. The patient was then awakened and taken to recovery in stable condition.  PLAN OF CARE: Admit to inpatient   PATIENT DISPOSITION:  PACU - hemodynamically stable.   Delay start of Pharmacological VTE agent (>24hrs) due to surgical blood loss or risk of bleeding: no

## 2016-06-04 NOTE — Anesthesia Procedure Notes (Signed)
Procedure Name: Intubation Performed by: Judeth Cornfield T Pre-anesthesia Checklist: Patient identified, Emergency Drugs available, Suction available, Patient being monitored and Timeout performed Patient Re-evaluated:Patient Re-evaluated prior to inductionOxygen Delivery Method: Circle system utilized Preoxygenation: Pre-oxygenation with 100% oxygen Intubation Type: IV induction, Cricoid Pressure applied and Rapid sequence Laryngoscope Size: Mac and 3 Grade View: Grade III Tube type: Oral Number of attempts: 1 Airway Equipment and Method: Stylet Placement Confirmation: ETT inserted through vocal cords under direct vision,  positive ETCO2,  CO2 detector and breath sounds checked- equal and bilateral Secured at: 22 cm Tube secured with: Tape Dental Injury: Teeth and Oropharynx as per pre-operative assessment

## 2016-06-04 NOTE — Interval H&P Note (Signed)
History and Physical Interval Note:  06/04/2016 8:36 AM  Roger Savage  has presented today for surgery, with the diagnosis of Bowel obstruction   The various methods of treatment have been discussed with the patient and family. After consideration of risks, benefits and other options for treatment, the patient has consented to  Procedure(s): EXPLORATORY LAPAROTOMY (N/A) as a surgical intervention .  The patient's history has been reviewed, patient examined, no change in status, stable for surgery.  I have reviewed the patient's chart and labs.  Questions were answered to the patient's satisfaction.     TOTH III,Artie Mcintyre S

## 2016-06-04 NOTE — Progress Notes (Signed)
  Subjective: No complaints. Still feels distended. No flatus  Objective: Vital signs in last 24 hours: Temp:  [98.6 F (37 C)-99 F (37.2 C)] 99 F (37.2 C) (06/26 0512) Pulse Rate:  [63-72] 69 (06/26 0512) Resp:  [18-19] 19 (06/26 0512) BP: (138-152)/(72-80) 138/72 mmHg (06/26 0512) SpO2:  [94 %-100 %] 95 % (06/26 0512) Last BM Date: 06/09/16  Intake/Output from previous day: June 10, 2023 0701 - 06/26 0700 In: 1562 [P.O.:120; I.V.:1442] Out: [id number] [Urine:1390; Emesis/NG [id number] Intake/Output this shift:    Resp: clear to auscultation bilaterally Cardio: regular rate and rhythm GI: soft, distended. nontender  Lab Results:   Recent Labs  06/02/16 0304 06/02/16 1111  WBC 5.9 4.9  HGB 10.5* 10.9*  HCT 35.9* 37.2*  PLT 257 251   BMET  Recent Labs  06/02/16 0304 06/02/16 1035  NA 138 137  K 3.2* 3.5  CL 104 104  CO2 23 21*  GLUCOSE 120* 122*  BUN 14 14  CREATININE 1.05 0.95  CALCIUM 9.2 9.0   PT/INR No results for input(s): LABPROT, INR in the last 72 hours. ABG No results for input(s): PHART, HCO3 in the last 72 hours.  Invalid input(s): PCO2, PO2  Studies/Results: Dg Abd 2 Views  June 09, 2016  CLINICAL DATA:  Small-bowel obstruction.  NG tube. EXAM: ABDOMEN - 2 VIEW COMPARISON:  Plain film of the abdomen from earlier same day. FINDINGS: Scattered air-fluid levels are seen within the upper abdomen, central abdomen and right lower quadrant. The small bowel distension is stable in the short-term interval, again suggesting small bowel obstruction. No evidence of free intraperitoneal air. No pathologic appearing calcifications. Mild degenerative spurring noted within the slightly scoliotic lumbar spine. IMPRESSION: 1. Persistent small bowel distension and scattered air-fluid levels, again suggesting some degree of small bowel obstruction. 2. Nasogastric tube coiled in the stomach fundus region. Electronically Signed   By: Franki Cabot M.D.   On: 2016-06-09 10:04    Dg Abd Portable 1v-small Bowel Obstruction Protocol-initial, 8 Hr Delay  06/02/2016  CLINICAL DATA:  8 hour delay image, small bowel obstruction EXAM: PORTABLE ABDOMEN - 1 VIEW COMPARISON:  None. FINDINGS: There is some diluted contrast material within stomach and proximal small bowel. Persistent moderate gaseous distended small bowel loop in mid abdomen highly suspicious for small bowel obstruction. NG tube has been removed. IMPRESSION: Persistent moderate gaseous distended small bowel loop in mid abdomen highly suspicious for small bowel obstruction. NG tube has been removed. Electronically Signed   By: Lahoma Crocker M.D.   On: 06/02/2016 13:45    Anti-infectives: Anti-infectives    None      Assessment/Plan: s/p * No surgery found * Since he has made no improvement I feel he would benefit from exploration to relieve obstruction. I have discussed with him in detail the risks and benefits of the surgery as well as some of the technical aspects and he understands and wishes to proceed. I will contact urology also because of his recent bladder neck repair.  LOS: 2 days    TOTH III,Geddy Boydstun S 06/04/2016

## 2016-06-04 NOTE — Progress Notes (Signed)
Report called to Short Stay. NO questions/complaints.

## 2016-06-04 NOTE — Progress Notes (Signed)
Consent signed. NGT clamped. Pt. Transported to short stay.

## 2016-06-04 NOTE — Anesthesia Preprocedure Evaluation (Addendum)
Anesthesia Evaluation  Patient identified by MRN, date of birth, ID band Patient awake    Reviewed: Allergy & Precautions, H&P , NPO status , Patient's Chart, lab work & pertinent test results  Airway Mallampati: II  TM Distance: >3 FB Neck ROM: full    Dental no notable dental hx. (+) Dental Advisory Given, Teeth Intact, Poor Dentition, Missing,    Pulmonary neg pulmonary ROS, former smoker,    Pulmonary exam normal breath sounds clear to auscultation       Cardiovascular Exercise Tolerance: Good negative cardio ROS Normal cardiovascular exam Rhythm:regular Rate:Normal     Neuro/Psych Seizures -, Well Controlled,  negative psych ROS   GI/Hepatic negative GI ROS, (+)     substance abuse  alcohol use, Abd pain,SBO   Endo/Other  negative endocrine ROS  Renal/GU negative Renal ROS  negative genitourinary   Musculoskeletal   Abdominal Normal abdominal exam  (+)   Peds  Hematology negative hematology ROS (+) anemia , hgb 10.9   Anesthesia Other Findings   Reproductive/Obstetrics negative OB ROS                           Anesthesia Physical Anesthesia Plan  ASA: III  Anesthesia Plan: General   Post-op Pain Management:    Induction: Intravenous  Airway Management Planned: Oral ETT  Additional Equipment:   Intra-op Plan:   Post-operative Plan: Extubation in OR  Informed Consent: I have reviewed the patients History and Physical, chart, labs and discussed the procedure including the risks, benefits and alternatives for the proposed anesthesia with the patient or authorized representative who has indicated his/her understanding and acceptance.   Dental advisory given  Plan Discussed with: CRNA  Anesthesia Plan Comments:        Anesthesia Quick Evaluation

## 2016-06-04 NOTE — H&P (View-Only) (Signed)
  Subjective: No complaints. Still feels distended. No flatus  Objective: Vital signs in last 24 hours: Temp:  [98.6 F (37 C)-99 F (37.2 C)] 99 F (37.2 C) (06/26 0512) Pulse Rate:  [63-72] 69 (06/26 0512) Resp:  [18-19] 19 (06/26 0512) BP: (138-152)/(72-80) 138/72 mmHg (06/26 0512) SpO2:  [94 %-100 %] 95 % (06/26 0512) Last BM Date: June 26, 2016  Intake/Output from previous day: 06-27-2023 0701 - 06/26 0700 In: 1562 [P.O.:120; I.V.:1442] Out: [id number] [Urine:1390; Emesis/NG [id number] Intake/Output this shift:    Resp: clear to auscultation bilaterally Cardio: regular rate and rhythm GI: soft, distended. nontender  Lab Results:   Recent Labs  06/02/16 0304 06/02/16 1111  WBC 5.9 4.9  HGB 10.5* 10.9*  HCT 35.9* 37.2*  PLT 257 251   BMET  Recent Labs  06/02/16 0304 06/02/16 1035  NA 138 137  K 3.2* 3.5  CL 104 104  CO2 23 21*  GLUCOSE 120* 122*  BUN 14 14  CREATININE 1.05 0.95  CALCIUM 9.2 9.0   PT/INR No results for input(s): LABPROT, INR in the last 72 hours. ABG No results for input(s): PHART, HCO3 in the last 72 hours.  Invalid input(s): PCO2, PO2  Studies/Results: Dg Abd 2 Views  2016/06/26  CLINICAL DATA:  Small-bowel obstruction.  NG tube. EXAM: ABDOMEN - 2 VIEW COMPARISON:  Plain film of the abdomen from earlier same day. FINDINGS: Scattered air-fluid levels are seen within the upper abdomen, central abdomen and right lower quadrant. The small bowel distension is stable in the short-term interval, again suggesting small bowel obstruction. No evidence of free intraperitoneal air. No pathologic appearing calcifications. Mild degenerative spurring noted within the slightly scoliotic lumbar spine. IMPRESSION: 1. Persistent small bowel distension and scattered air-fluid levels, again suggesting some degree of small bowel obstruction. 2. Nasogastric tube coiled in the stomach fundus region. Electronically Signed   By: Franki Cabot M.D.   On: Jun 26, 2016 10:04    Dg Abd Portable 1v-small Bowel Obstruction Protocol-initial, 8 Hr Delay  06/02/2016  CLINICAL DATA:  8 hour delay image, small bowel obstruction EXAM: PORTABLE ABDOMEN - 1 VIEW COMPARISON:  None. FINDINGS: There is some diluted contrast material within stomach and proximal small bowel. Persistent moderate gaseous distended small bowel loop in mid abdomen highly suspicious for small bowel obstruction. NG tube has been removed. IMPRESSION: Persistent moderate gaseous distended small bowel loop in mid abdomen highly suspicious for small bowel obstruction. NG tube has been removed. Electronically Signed   By: Lahoma Crocker M.D.   On: 06/02/2016 13:45    Anti-infectives: Anti-infectives    None      Assessment/Plan: s/p * No surgery found * Since he has made no improvement I feel he would benefit from exploration to relieve obstruction. I have discussed with him in detail the risks and benefits of the surgery as well as some of the technical aspects and he understands and wishes to proceed. I will contact urology also because of his recent bladder neck repair.  LOS: 2 days    TOTH III,PAUL S 06/04/2016

## 2016-06-05 ENCOUNTER — Encounter (HOSPITAL_COMMUNITY): Payer: Self-pay | Admitting: General Surgery

## 2016-06-05 MED ORDER — MORPHINE SULFATE 2 MG/ML IV SOLN
INTRAVENOUS | Status: DC
  Administered 2016-06-05: 9 mg via INTRAVENOUS
  Administered 2016-06-06: 3 mg via INTRAVENOUS
  Administered 2016-06-06: 4.5 mg via INTRAVENOUS
  Administered 2016-06-06 – 2016-06-07 (×2): 1.5 mg via INTRAVENOUS
  Administered 2016-06-07: 9 mg via INTRAVENOUS
  Filled 2016-06-05 (×2): qty 25

## 2016-06-05 MED ORDER — DIPHENHYDRAMINE HCL 50 MG/ML IJ SOLN: 12.5000 mg | Freq: Four times a day (QID) | INTRAMUSCULAR | Status: DC | PRN

## 2016-06-05 MED ORDER — DIPHENHYDRAMINE HCL 12.5 MG/5ML PO ELIX: 12.5000 mg | ORAL_SOLUTION | Freq: Four times a day (QID) | ORAL | Status: DC | PRN

## 2016-06-05 MED ORDER — SODIUM CHLORIDE 0.9% FLUSH: 9.0000 mL | INTRAVENOUS | Status: DC | PRN

## 2016-06-05 MED ORDER — ONDANSETRON HCL 4 MG/2ML IJ SOLN: 4.0000 mg | Freq: Four times a day (QID) | INTRAMUSCULAR | Status: DC | PRN

## 2016-06-05 MED ORDER — NALOXONE HCL 0.4 MG/ML IJ SOLN: 0.4000 mg | INTRAMUSCULAR | Status: DC | PRN

## 2016-06-05 MED ORDER — MORPHINE SULFATE 2 MG/ML IV SOLN
INTRAVENOUS | Status: DC
  Administered 2016-06-05: 3 mg via INTRAVENOUS
  Administered 2016-06-05: 12:00:00 via INTRAVENOUS

## 2016-06-05 NOTE — Progress Notes (Signed)
Offered Pt. A bath and Pt. Refused for right now he said he might change hes mind a little later

## 2016-06-05 NOTE — Care Management (Addendum)
Patient's PCP is Dr Alferd Patee at [facility] [address] [place] Called same spoke with Santiago Glad .  Santiago Glad  aware patient is at [facility], asked to speak directly to Dr Lanell Matar office. Santiago Glad explained process is , she takes NCM contact information and gives it to Dr Alferd Patee 's clinic and they return call. Message left awaiting call back .   Magdalen Spatz RN BSN [phone]

## 2016-06-05 NOTE — Progress Notes (Signed)
[facility] Progress Note  1 Day Post-Op  Subjective: POD#1 laparotomy with LOA for SBO. Reports severe abdominal pain around laparotomy incision site not currently controlled with IV pain meds. Denies flatus or BM. Requests bath.  24h NG: 1360 cc, bilious   Objective: Vital signs in last 24 hours: Temp:  [97.5 F (36.4 C)-99.7 F (37.6 C)] 99.7 F (37.6 C) (06/27 0536) Pulse Rate:  [75-87] 86 (06/27 0536) Resp:  [18-26] 19 (06/27 0536) BP: (145-189)/(79-107) 159/79 mmHg (06/27 0536) SpO2:  [92 %-100 %] 92 % (06/27 0536) Last BM Date: 06/01/16  Intake/Output from previous day: 06/26 0701 - 06/27 0700 In: 2310 [I.V.:2200; NG/GT:60; IV Piggyback:50] Out: 2250 [Urine:740; Emesis/NG output:1360; Blood:50] Intake/Output this shift:   PE: Gen:  Alert, NAD, pleasant HEENT: NG in place Card:  RRR, no M/G/R heard Pulm:  CTA, no W/R/R Abd: soft, distended, appropriately TTP, hypoactive BS, laparotomy site with honeycomb dressing and no leakage; no peritonitis or guarding. GU: foley in place. Ext:  No erythema, edema, or tenderness  Assessment/Plan SBO s/p laparotomy with LOA by Dr. Marlou Starks (POD#1) - continue NG - switch to PCA for pain control PMH robotic prostatectomy for prostate cancer PMH transurethral surgery for bladder neck stricture - will plan to d/c foley tomorrow   FEN: continue NPO, IVF  ID: none  DVT Proph: re-start Lovenox today, SCD's Dispo: wait for bowel function to start back up, NG, pain control    LOS: 3 days    Jill Alexanders , [facility] 06/05/2016, 9:39 AM Pager: [phone] Mon-Fri 7:00 am-4:30 pm Sat-Sun 7:00 am-11:30 am

## 2016-06-06 NOTE — Progress Notes (Signed)
[facility] Progress Note  2 Days Post-Op  Subjective: POD#2 laparotomy with LOA for SBO.    24h NG OP: 1,250 cc  Objective: Vital signs in last 24 hours: Temp:  [97.8 F (36.6 C)-99.1 F (37.3 C)] 99.1 F (37.3 C) (06/28 0539) Pulse Rate:  [66-75] 66 (06/28 0539) Resp:  [18-24] 21 (06/28 0539) BP: (127-164)/(74-84) 154/77 mmHg (06/28 0539) SpO2:  [92 %-100 %] 100 % (06/28 0539) Last BM Date: 06/01/16  Intake/Output from previous day: 06/27 0701 - 06/28 0700 In: 1050 [I.V.:950; IV Piggyback:100] Out: 2350 [Urine:1100; Emesis/NG output:1250] Intake/Output this shift:    PE: Gen: Alert, NAD, pleasant HEENT: NG in place Card: RRR, no M/G/R heard Pulm: CTA, no W/R/R Abd: soft, distended, appropriately TTP, hypoactive BS, laparotomy site with honeycomb dressing and no leakage; no peritonitis or guarding. GU: foley in place. Ext: No erythema, edema, or tenderness  Lab Results:  No results for input(s): WBC, HGB, HCT, PLT in the last 72 hours. BMET No results for input(s): NA, K, CL, CO2, GLUCOSE, BUN, CREATININE, CALCIUM in the last 72 hours. PT/INR No results for input(s): LABPROT, INR in the last 72 hours. CMP     Component Value Date/Time   NA 137 06/02/2016 1035   K 3.5 06/02/2016 1035   CL 104 06/02/2016 1035   CO2 21* 06/02/2016 1035   GLUCOSE 122* 06/02/2016 1035   BUN 14 06/02/2016 1035   CREATININE 0.95 06/02/2016 1035   CALCIUM 9.0 06/02/2016 1035   PROT 8.5* 06/01/2016 1249   ALBUMIN 4.6 06/01/2016 1249   AST 30 06/01/2016 1249   ALT 17 06/01/2016 1249   ALKPHOS 69 06/01/2016 1249   BILITOT 0.5 06/01/2016 1249   GFRNONAA >60 06/02/2016 1035   GFRAA >60 06/02/2016 1035   Lipase     Component Value Date/Time   LIPASE 18 06/01/2016 1249   Studies/Results: No results found.  Anti-infectives: Anti-infectives    None     Assessment/Plan SBO s/p laparotomy with LOA by Dr. Marlou Starks (POD#2) - continue NG - PCA PMH robotic  prostatectomy for prostate cancer PMH transurethral surgery for bladder neck stricture   FEN: continue NPO, IVF  ID: none  DVT Proph: re-start Lovenox today, SCD's Dispo:  NPO, pain control. Advance diet after flatus.   LOS: 4 days    [facility] 06/06/2016, 8:33 AM Pager: [phone] Mon-Fri 7:00 am-4:30 pm Sat-Sun 7:00 am-11:30 am

## 2016-06-07 MED ORDER — ONDANSETRON HCL 4 MG PO TABS: 4.0000 mg | ORAL_TABLET | Freq: Three times a day (TID) | ORAL | Status: DC | PRN

## 2016-06-07 MED ORDER — ONDANSETRON HCL 4 MG/2ML IJ SOLN: 4.0000 mg | Freq: Four times a day (QID) | INTRAMUSCULAR | Status: DC | PRN

## 2016-06-07 MED ORDER — OXYCODONE HCL 5 MG PO TABS
5.0000 mg | ORAL_TABLET | ORAL | Status: DC | PRN
Start: 2016-06-07 — End: 2016-06-10
  Administered 2016-06-07 – 2016-06-10 (×10): 10 mg via ORAL
  Filled 2016-06-07 (×10): qty 2

## 2016-06-07 MED ORDER — MORPHINE SULFATE (PF) 2 MG/ML IV SOLN
1.0000 mg | INTRAVENOUS | Status: DC | PRN
  Administered 2016-06-08 – 2016-06-10 (×4): 1 mg via INTRAVENOUS
  Filled 2016-06-07 (×5): qty 1

## 2016-06-07 NOTE — Progress Notes (Signed)
[facility] Progress Note  3 Days Post-Op  Subjective: POD#3 laparotomy with LOA for SBO. Pain controlled. 1 large BM last night, 1 this morning. Ambulating. Urinating without hesitancy.   Objective: Vital signs in last 24 hours: Temp:  [98.4 F (36.9 C)-99.3 F (37.4 C)] 98.8 F (37.1 C) (06/29 0502) Pulse Rate:  [61-70] 61 (06/29 0502) Resp:  [20-28] 20 (06/29 0502) BP: (137-163)/(77-85) 137/77 mmHg (06/29 0502) SpO2:  [98 %-100 %] 99 % (06/29 0502) Last BM Date: 06/06/16  Intake/Output from previous day: 06/28 0701 - 06/29 0700 In: 2540 [P.O.:360; I.V.:2050; NG/GT:30; IV Piggyback:100] Out: 2025 [Urine:1025; Emesis/NG output:1000] Intake/Output this shift:    PE: Gen: Alert, NAD, pleasant HEENT: NG in place Card: RRR, no M/G/R heard Pulm: CTA, no W/R/R Abd: soft, mildly distended, appropriately TTP, +BS, laparotomy incision with staples c/d/i  Lab Results:  No results for input(s): WBC, HGB, HCT, PLT in the last 72 hours. BMET No results for input(s): NA, K, CL, CO2, GLUCOSE, BUN, CREATININE, CALCIUM in the last 72 hours. PT/INR No results for input(s): LABPROT, INR in the last 72 hours. CMP     Component Value Date/Time   NA 137 06/02/2016 1035   K 3.5 06/02/2016 1035   CL 104 06/02/2016 1035   CO2 21* 06/02/2016 1035   GLUCOSE 122* 06/02/2016 1035   BUN 14 06/02/2016 1035   CREATININE 0.95 06/02/2016 1035   CALCIUM 9.0 06/02/2016 1035   PROT 8.5* 06/01/2016 1249   ALBUMIN 4.6 06/01/2016 1249   AST 30 06/01/2016 1249   ALT 17 06/01/2016 1249   ALKPHOS 69 06/01/2016 1249   BILITOT 0.5 06/01/2016 1249   GFRNONAA >60 06/02/2016 1035   GFRAA >60 06/02/2016 1035   Lipase     Component Value Date/Time   LIPASE 18 06/01/2016 1249   Studies/Results: No results found.  Anti-infectives: Anti-infectives    None     Assessment/Plan SBO s/p laparotomy with LOA by Dr. Marlou Starks (POD#3) - d/c NG - start clears - d/c PCA and start low dose  morphine + orals  PMH robotic prostatectomy for prostate cancer PMH transurethral surgery for bladder neck stricture   FEN: clears, decrease IVF ID: none  DVT : Lovenox, SCD's Dispo: d/c NGT and adv diet    LOS: 5 days    Jill Alexanders , [facility] 06/07/2016, 7:42 AM Pager: [phone] Mon-Fri 7:00 am-4:30 pm Sat-Sun 7:00 am-11:30 am

## 2016-06-08 LAB — CREATININE, SERUM
Creatinine, Ser: 0.87 mg/dL (ref 0.61–1.24)
GFR calc non Af Amer: >60 mL/min (ref >60)

## 2016-06-08 MED ORDER — PAROXETINE HCL 20 MG PO TABS
40.0000 mg | ORAL_TABLET | Freq: Every morning | ORAL | Status: DC
  Administered 2016-06-08 – 2016-06-10 (×3): 40 mg via ORAL
  Filled 2016-06-08 (×3): qty 2

## 2016-06-08 MED ORDER — ACETAMINOPHEN 325 MG PO TABS: 650.0000 mg | ORAL_TABLET | ORAL | Status: DC | PRN

## 2016-06-08 MED ORDER — OXYBUTYNIN CHLORIDE 5 MG PO TABS
5.0000 mg | ORAL_TABLET | Freq: Four times a day (QID) | ORAL | Status: DC
  Administered 2016-06-08 – 2016-06-10 (×8): 5 mg via ORAL
  Filled 2016-06-08 (×8): qty 1

## 2016-06-08 MED ORDER — FAMOTIDINE 20 MG PO TABS
20.0000 mg | ORAL_TABLET | Freq: Two times a day (BID) | ORAL | Status: DC
  Administered 2016-06-08 – 2016-06-10 (×5): 20 mg via ORAL
  Filled 2016-06-08 (×5): qty 1

## 2016-06-08 NOTE — Progress Notes (Signed)
4 Days Post-Op  Subjective: He is doing great up in the chair and tolerating Clears well.  + BM x 2.  He had drainage from the lower portion of his abdominal wound.  I put him in bed and took out 5 staples.  He had an area 4 x 3 cm open and draining purulent fluid.  This area is now packed wet to dry and will start dressing changes.    Objective: Vital signs in last 24 hours: Temp:  [98.3 F (36.8 C)-99.4 F (37.4 C)] 98.4 F (36.9 C) (06/30 0627) Pulse Rate:  [67-99] 99 (06/30 0627) Resp:  [20-23] 22 (06/30 0627) BP: (150-193)/(82-91) 150/86 mmHg (06/30 0627) SpO2:  [97 %] 97 % (06/30 0627) Last BM Date: 06/07/16 480 PO recorded 500 urine recorded BM  X 2   Afebrile, Borderline BP elevation,  No labs Last film 6/25 Intake/Output from previous day: 06/29 0701 - 06/30 0700 In: 1527.1 [P.O.:480; I.V.:997.1; IV Piggyback:50] Out: 500 [Urine:500] Intake/Output this shift:    General appearance: alert, cooperative and no distress Resp: clear to auscultation bilaterally GI: soft, still a little distended, + BS, + BM, wound with drainage and opened as noted above.  Lab Results:  No results for input(s): WBC, HGB, HCT, PLT in the last 72 hours.  BMET No results for input(s): NA, K, CL, CO2, GLUCOSE, BUN, CREATININE, CALCIUM in the last 72 hours. PT/INR No results for input(s): LABPROT, INR in the last 72 hours.   Recent Labs Lab 06/01/16 1249  AST 30  ALT 17  ALKPHOS 69  BILITOT 0.5  PROT 8.5*  ALBUMIN 4.6     Lipase     Component Value Date/Time   LIPASE 18 06/01/2016 1249     Studies/Results: No results found. Prior to Admission medications   Medication Sig Start Date End Date Taking? Authorizing Provider  albuterol (PROVENTIL HFA;VENTOLIN HFA) 108 (90 BASE) MCG/ACT inhaler Inhale 2 puffs into the lungs 4 (four) times daily.   Yes Historical Provider, MD  Cholecalciferol (VITAMIN D3) 2000 UNITS TABS Take 2,000 Units by mouth daily.   Yes Historical Provider,  MD  oxybutynin (DITROPAN) 5 MG tablet Take 5 mg by mouth 4 (four) times daily.   Yes Historical Provider, MD  PARoxetine (PAXIL) 40 MG tablet Take 40 mg by mouth every morning.   Yes Historical Provider, MD  senna-docusate (SENOKOT-S) 8.6-50 MG per tablet Take 2 tablets by mouth 2 (two) times daily as needed (for constipation).   Yes Historical Provider, MD  traZODone (DESYREL) 150 MG tablet Take 300 mg by mouth at bedtime as needed for sleep.   Yes Historical Provider, MD    Medications: . enoxaparin (LOVENOX) injection  40 mg Subcutaneous Q24H  . famotidine (PEPCID) IV  20 mg Intravenous Q12H   . 0.45 % NaCl with KCl 20 mEq / L 75 mL/hr at 06/07/16 1550   Hx of anxiety/depression Sleep deprivation Hx of prostate cancer Hx of Seizures Remote hx of tobacco use, Inhalers PRN  Assessment/Plan SBO s/p laparotomy with LOA by Dr. Marlou Starks (POD#3) Post op wound draining - staples removed and start wet to dry dressing PMH robotic prostatectomy for prostate cancer PMH transurethral surgery for bladder neck stricture   FEN: start him on full liquids this AM, soft diet by supper if OK, decrease IVF. ID: none  DVT : Lovenox, SCD's Dispo: Wound opened and will start wet to dry dressing changes.  Ask case manager to see and help setting up home care.  Restart Paxil and change to PO Pepcid.  If he does well he may be able to go home this weekend.          LOS: 6 days    Roger Savage 06/08/2016 [phone]

## 2016-06-08 NOTE — Discharge Instructions (Signed)
[facility]      [facility] [phone]  OPEN ABDOMINAL SURGERY: POST OP INSTRUCTIONS  Always review your discharge instruction sheet given to you by the facility where your surgery was performed.  IF YOU HAVE DISABILITY OR FAMILY LEAVE FORMS, YOU MUST BRING THEM TO THE OFFICE FOR PROCESSING.  PLEASE DO NOT GIVE THEM TO YOUR DOCTOR.  1. A prescription for pain medication may be given to you upon discharge.  Take your pain medication as prescribed, if needed.  If narcotic pain medicine is not needed, then you may take acetaminophen (Tylenol) or ibuprofen (Advil) as needed. 2. Take your usually prescribed medications unless otherwise directed. 3. If you need a refill on your pain medication, please contact your pharmacy. They will contact our office to request authorization.  Prescriptions will not be filled after 5pm or on week-ends. 4. You should follow a light diet the first few days after arrival home, such as soup and crackers, pudding, etc.unless your doctor has advised otherwise. A high-fiber, low fat diet can be resumed as tolerated.   Be sure to include lots of fluids daily. Most patients will experience some swelling and bruising on the chest and neck area.  Ice packs will help.  Swelling and bruising can take several days to resolve 5. Most patients will experience some swelling and bruising in the area of the incision. Ice pack will help. Swelling and bruising can take several days to resolve..  6. It is common to experience some constipation if taking pain medication after surgery.  Increasing fluid intake and taking a stool softener will usually help or prevent this problem from occurring.  A mild laxative (Milk of Magnesia or Miralax) should be taken according to package directions if there are no bowel movements after 48 hours. 7.  You may have steri-strips (small skin tapes) in place directly over the incision.  These strips should be left on the skin for 7-10 days.  If your  surgeon used skin glue on the incision, you may shower in 24 hours.  The glue will flake off over the next 2-3 weeks.  Any sutures or staples will be removed at the office during your follow-up visit. You may find that a light gauze bandage over your incision may keep your staples from being rubbed or pulled. You may shower and replace the bandage daily. 8. ACTIVITIES:  You may resume regular (light) daily activities beginning the next day--such as daily self-care, walking, climbing stairs--gradually increasing activities as tolerated.  You may have sexual intercourse when it is comfortable.  Refrain from any heavy lifting or straining until approved by your doctor. a. You may drive when you no longer are taking prescription pain medication, you can comfortably wear a seatbelt, and you can safely maneuver your car and apply brakes b. Return to Work: ___________________________________ 9. You should see your doctor in the office for a follow-up appointment approximately two weeks after your surgery.  Make sure that you call for this appointment within a day or two after you arrive home to insure a convenient appointment time. OTHER INSTRUCTIONS:  _____________________________________________________________ _____________________________________________________________  WHEN TO CALL YOUR DOCTOR: 1. Fever over 101.0 2. Inability to urinate 3. Nausea and/or vomiting 4. Extreme swelling or bruising 5. Continued bleeding from incision. 6. Increased pain, redness, or drainage from the incision. 7. Difficulty swallowing or breathing 8. Muscle cramping or spasms. 9. Numbness or tingling in hands or feet or around lips.  The clinic staff is available to   answer your questions during regular business hours.  Please don't hesitate to call and ask to speak to one of the nurses if you have concerns.  For further questions, please visit [email or web address]   

## 2016-06-09 LAB — CBC
HEMATOCRIT: 32.7 % — AB (ref 39.0–52.0)
HEMOGLOBIN: 9.4 g/dL — AB (ref 13.0–17.0)
MCH: 21.3 pg — ABNORMAL LOW (ref 26.0–34.0)
MCHC: 28.7 g/dL — ABNORMAL LOW (ref 30.0–36.0)
MCV: 74 fL — ABNORMAL LOW (ref 78.0–100.0)
Platelets: 315 10*3/uL (ref 150–400)
RBC: 4.42 MIL/uL (ref 4.22–5.81)
RDW: 20.6 % — ABNORMAL HIGH (ref 11.5–15.5)
WBC: 7.4 10*3/uL (ref 4.0–10.5)

## 2016-06-09 LAB — BASIC METABOLIC PANEL
ANION GAP: 10 (ref 5–15)
BUN: 10 mg/dL (ref 6–20)
CHLORIDE: 104 mmol/L (ref 101–111)
CO2: 24 mmol/L (ref 22–32)
Calcium: 9.6 mg/dL (ref 8.9–10.3)
Creatinine, Ser: 0.88 mg/dL (ref 0.61–1.24)
GFR calc non Af Amer: >60 mL/min (ref >60)
Glucose, Bld: 114 mg/dL — ABNORMAL HIGH (ref 65–99)
POTASSIUM: 4 mmol/L (ref 3.5–5.1)
Sodium: 138 mmol/L (ref 135–145)

## 2016-06-09 NOTE — Progress Notes (Signed)
Patient ID: Roger Savage, male   DOB: 1951/08/07, 65 y.o.   MRN: [id number]  [facility]  Subjective: POD#5 - patient in bed, comfortable.  No complaints.  Starting regular diet this AM.  Having flatus and BM's.  Ambulating.  Objective: Vital signs in last 24 hours: Temp:  [98.5 F (36.9 C)-99 F (37.2 C)] 99 F (37.2 C) (07/01 0551) Pulse Rate:  [65-80] 77 (07/01 0551) Resp:  [18-20] 18 (07/01 0551) BP: (145-161)/(78-87) 150/78 mmHg (07/01 0551) SpO2:  [97 %-98 %] 97 % (07/01 0551) Last BM Date: 06/08/16  Intake/Output from previous day: 06/30 0701 - 07/01 0700 In: 29 [P.O.:460] Out: 100 [Urine:100] Intake/Output this shift:    Physical Exam: HEENT - sclerae clear, mucous membranes moist Neck - soft Chest - clear bilaterally Cor - RRR Abdomen - soft, mild distension; active BS; upper midline wound intact; dry dressing lower midline wound Ext - no edema, non-tender Neuro - alert & oriented, no focal deficits  Lab Results:   Recent Labs  06/09/16 0506  WBC 7.4  HGB 9.4*  HCT 32.7*  PLT 315   BMET  Recent Labs  06/08/16 0638 06/09/16 0506  NA  --  138  K  --  4.0  CL  --  104  CO2  --  24  GLUCOSE  --  114*  BUN  --  10  CREATININE 0.87 0.88  CALCIUM  --  9.6   PT/INR No results for input(s): LABPROT, INR in the last 72 hours. Comprehensive Metabolic Panel:    Component Value Date/Time   NA 138 06/09/2016 0506   NA 137 06/02/2016 1035   K 4.0 06/09/2016 0506   K 3.5 06/02/2016 1035   CL 104 06/09/2016 0506   CL 104 06/02/2016 1035   CO2 24 06/09/2016 0506   CO2 21* 06/02/2016 1035   BUN 10 06/09/2016 0506   BUN 14 06/02/2016 1035   CREATININE 0.88 06/09/2016 0506   CREATININE 0.87 06/08/2016 0638   GLUCOSE 114* 06/09/2016 0506   GLUCOSE 122* 06/02/2016 1035   CALCIUM 9.6 06/09/2016 0506   CALCIUM 9.0 06/02/2016 1035   AST 30 06/01/2016 1249   AST 32 01/15/2014 1331   ALT 17 06/01/2016 1249   ALT  17 01/15/2014 1331   ALKPHOS 69 06/01/2016 1249   ALKPHOS 105 01/15/2014 1331   BILITOT 0.5 06/01/2016 1249   BILITOT 0.2* 01/15/2014 1331   PROT 8.5* 06/01/2016 1249   PROT 8.7* 01/15/2014 1331   ALBUMIN 4.6 06/01/2016 1249   ALBUMIN 4.0 01/15/2014 1331    Studies/Results: No results found.  Assessment & Plans: SBO s/p laparotomy with LOA by Dr. Marlou Starks (POD#5)  Partial open wound - begin dressing changes  Will need HHN for wound care  FEN: starting regular diet this AM DVT : Lovenox, SCD's Dispo: Wound opened and will start wet to dry dressing changes. Ask case manager to see and help setting up home care. Home 1-2 days.  Earnstine Regal, MD, [facility] Office: [place] 06/09/2016

## 2016-06-10 MED ORDER — OXYCODONE HCL 5 MG PO TABS: 5.0000 mg | ORAL_TABLET | ORAL | Status: DC | PRN

## 2016-06-10 NOTE — Care Management Note (Addendum)
Case Management Note  Patient Details  Name: Roger Savage MRN: [id number] Date of Birth: [birth year]  Subjective/Objective:                  PCP is Dr Alferd Patee at [place] 1 [id number] Action/Plan: Discharge planning Expected Discharge Date:  06/10/16               Expected Discharge Plan:  [place]  In-House Referral:     Discharge planning Services  CM Consult  Post Acute Care Choice:    Choice offered to:     DME Arranged:    DME Agency:  [place]:  RN [facility] Agency:  [place]  Status of Service:  In process, will continue to follow  If discussed at Long Length of Stay Meetings, dates discussed:    Additional Comments: 14:27 Cm received call from [facility] rep, rescinding acceptance of pt as authorization cannot be secured.  Cm called the pt who states his sister can be taught by RN for dressing changes.  Cm encouraged pt to call the [facility] in [place] for follow up care.  CM called RN who states she will teach wound care with sister of pt.  No other CM needs wee communicated. CM received call from RN for [facility] wound care.  CM has requested HHRN orders and pt's only insurance is [place].  VA insurance not accepted by any agency but [facility] and Referral made to [facility]; [facility]  is willing to take pt and will not be able to verify until 06/11/16 (VA not open on wknd).  RN aware to send home enough dressing supplies and to get order from MD.    Dellie Catholic, RN 06/10/2016, 1:06 PM

## 2016-06-10 NOTE — Progress Notes (Signed)
Patient ID: Roger Savage, male   DOB: [birth year], 64 y.o.   MRN: [id number]  [facility]  Subjective: POD#6 - patient up in bed, starting breakfast.  Doing well.  Wants to go home today.  Objective: Vital signs in last 24 hours: Temp:  [98.7 F (37.1 C)-99 F (37.2 C)] 98.7 F (37.1 C) (07/02 0459) Pulse Rate:  [67-68] 67 (07/02 0459) Resp:  [18-20] 18 (07/02 0459) BP: (143-147)/(75-76) 143/75 mmHg (07/02 0459) SpO2:  [97 %] 97 % (07/02 0459) Last BM Date: 06/09/16  Intake/Output from previous day:   Intake/Output this shift:    Physical Exam: HEENT - sclerae clear, mucous membranes moist Neck - soft Chest - clear bilaterally Cor - RRR Abdomen - soft without distension; upper wound intact with staples; lower wound packed wet to dry; BS present Ext - no edema, non-tender Neuro - alert & oriented, no focal deficits  Lab Results:   Recent Labs  06/09/16 0506  WBC 7.4  HGB 9.4*  HCT 32.7*  PLT 315   BMET  Recent Labs  06/08/16 0638 06/09/16 0506  NA  --  138  K  --  4.0  CL  --  104  CO2  --  24  GLUCOSE  --  114*  BUN  --  10  CREATININE 0.87 0.88  CALCIUM  --  9.6   PT/INR No results for input(s): LABPROT, INR in the last 72 hours. Comprehensive Metabolic Panel:    Component Value Date/Time   NA 138 06/09/2016 0506   NA 137 06/02/2016 1035   K 4.0 06/09/2016 0506   K 3.5 06/02/2016 1035   CL 104 06/09/2016 0506   CL 104 06/02/2016 1035   CO2 24 06/09/2016 0506   CO2 21* 06/02/2016 1035   BUN 10 06/09/2016 0506   BUN 14 06/02/2016 1035   CREATININE 0.88 06/09/2016 0506   CREATININE 0.87 06/08/2016 0638   GLUCOSE 114* 06/09/2016 0506   GLUCOSE 122* 06/02/2016 1035   CALCIUM 9.6 06/09/2016 0506   CALCIUM 9.0 06/02/2016 1035   AST 30 06/01/2016 1249   AST 32 01/15/2014 1331   ALT 17 06/01/2016 1249   ALT 17 01/15/2014 1331   ALKPHOS 69 06/01/2016 1249   ALKPHOS 105 01/15/2014 1331   BILITOT 0.5  06/01/2016 1249   BILITOT 0.2* 01/15/2014 1331   PROT 8.5* 06/01/2016 1249   PROT 8.7* 01/15/2014 1331   ALBUMIN 4.6 06/01/2016 1249   ALBUMIN 4.0 01/15/2014 1331    Studies/Results: No results found.  Assessment & Plans: Status post ex lap with LOA for SBO Open wound for superficial wound infection  Discharge home today  [facility] for dressing changes to begin tomorrow BID  Will arrange follow up appt later this week at [place] office for wound check and staple removal  Earnstine Regal, MD, [facility] Office: [place] 06/10/2016

## 2016-06-14 NOTE — Discharge Summary (Signed)
Physician Discharge Summary  Patient ID: Roger Savage MRN: [id number] DOB/AGE: February 27, 1951 65 y.o.  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 Alden Benjamin Course:  Roger Savage was woken from sleep this morning with severe abdominal pain. He tried taking Alka-Seltzer and PeptoBismal without relief. He had nausea. When he didn't get any relief he called EMS and then vomited on the ride in. He denies any prior episodes of similar symptoms. He has a history of prostate cancer. He thinks treatment of that led to a bladder outlet obstruction and had that operated on at [facility] early last month. He was seen in the ED at [facility] and admitted by Dr. Barry Dienes.  He was placed on the SB protocol, with no real improvement of bowel function and no contrast was found in the films for the SB protocol.  Bowel rest, NG compression, and hydration did not resolve his obstruction.  He was taken to the OR on 06/04/16 by Dr. Marlou Starks.   He did well post op and his diet was advanced.  We took out several staple for abdominal wound drainage on 06/08/16.  Wet to dry dressing were started for this area.    He made good progress over the weekend and was discharged over the weekend by Dr. Harlow Asa.  He was tolerating a regular diet and had BM.  Home health was set up to help with dressing changes.    CBC Latest Ref Rng 06/09/2016 06/02/2016 06/02/2016  WBC 4.0 - 10.5 K/uL 7.4 4.9 5.9  Hemoglobin 13.0 - 17.0 g/dL 9.4(L) 10.9(L) 10.5(L)  Hematocrit 39.0 - 52.0 % 32.7(L) 37.2(L) 35.9(L)  Platelets 150 - 400 K/uL 315 251 257   CMP Latest Ref Rng 06/09/2016 06/08/2016 06/02/2016  Glucose 65 - 99 mg/dL 114(H) -  122(H)  BUN 6 - 20 mg/dL 10 - 14  Creatinine 0.61 - 1.24 mg/dL 0.88 0.87 0.95  Sodium 135 - 145 mmol/L 138 - 137  Potassium 3.5 - 5.1 mmol/L 4.0 - 3.5  Chloride 101 - 111 mmol/L 104 - 104  CO2 22 - 32 mmol/L 24 - 21(L)  Calcium 8.9 - 10.3 mg/dL 9.6 - 9.0  Total Protein 6.5 - 8.1 g/dL - - -  Total Bilirubin 0.3 - 1.2 mg/dL - - -  Alkaline Phos 38 - 126 U/L - - -  AST 15 - 41 U/L - - -  ALT 17 - 63 U/L - - -    Condition on d/c:  Improved    Disposition: 01-Home or Self Care  Discharge Instructions    Ambulatory referral to Home Health    Complete by:  As directed   Please evaluate Roger Savage for admission to [facility].  Disciplines requested: Nursing  Services to provide: [facility]  Physician to follow patient's care (the person listed here will be responsible for signing ongoing orders): Other: [facility]   Requested Start of Care Date: Tomorrow  I certify that this patient is under my care and that I, or a Nurse Practitioner or Physician's Assistant working with me, had a face-to-face encounter that meets the physician face-to-face requirements with patient on 06/10/16. The encounter with the patient was in whole, or in part for the following medical condition(s)  which is the primary reason for home health care (List medical condition). S/P lysis of adhesions for small bowel obstruction  Special Instructions:  Wet to dry dressing to abdominal wound BID  Does the patient have Medicare or Medicaid?:  No  The encounter with the patient was in whole, or in part, for the following medical condition, which is the primary reason for home health care:  S/P surgeru for small bowel obstruction  Reason for Medically Necessary Home Health Services:  Skilled Nursing- Complex Wound Care  My clinical findings support the need for the above services:  Unable to leave home safely without assistance and/or assistive device  I certify that, based on my findings, the following services are  medically necessary home health services:  Nursing  Further, I certify that my clinical findings support that this patient is homebound due to:  Unable to leave home safely without assistance     Change dressing (specify)    Complete by:  As directed   Dressing change: two times per day using normal saline moistened gauze wet to dry.  May shower with wound open once daily and then replace dressing.     Diet - low sodium heart healthy    Complete by:  As directed      Discharge instructions    Complete by:  As directed   [facility]  OPEN ABDOMINAL SURGERY: POST OP INSTRUCTIONS  Always review your discharge instruction sheet given to you by the facility where your surgery was performed.  A prescription for pain medication may be given to you upon discharge.  Take your pain medication as prescribed.  If narcotic pain medicine is not needed, then you may take acetaminophen (Tylenol) or ibuprofen (Advil) as needed. Take your usually prescribed medications unless otherwise directed. If you need a refill on your pain medication, please contact your pharmacy. They will contact our office to request authorization.  Prescriptions will not be filled after 5 pm or on weekends. You should follow a light diet the first few days after arrival home, such as soup and crackers, unless your doctor has advised otherwise. A high-fiber, low fat diet can be resumed as tolerated.  Be sure to include plenty of fluids daily.  Most patients will experience some swelling and bruising in the area of the incision. Ice packs will help. Swelling and bruising can take several days to resolve. It is common to experience some constipation if taking pain medication after surgery.  Increasing fluid intake and taking a stool softener will usually help or prevent this problem from occurring.  A mild laxative (Milk of Magnesia or Miralax) should be taken according to package directions if there are no bowel movements  after 48 hours.  You may have steri-strips (small skin tapes) in place directly over the incision.  These strips should be left on the skin for 5-7 days.  Any sutures or staples will be removed at the office during your follow-up visit. You may find that a light gauze bandage over your incision may keep your staples from being rubbed or pulled. You may shower and replace the bandage daily. ACTIVITIES:  You may resume regular (light) daily activities beginning the next day - such as daily self-care, walking, climbing stairs - gradually increasing activities as tolerated.  You may have sexual intercourse when it is comfortable.  Refrain from any heavy lifting or straining until approved by your doctor.  You may drive when you no longer are taking prescription  pain medication, you can comfortably wear a seatbelt, and you can safely maneuver your car and apply brakes. You should see your doctor in the office for a follow-up appointment approximately 2-3 weeks after your surgery.  Make sure that you call for this appointment within a day or two after you arrive home to insure a convenient appointment time.  WHEN TO CALL YOUR DOCTOR: Fever greater than 101.0 Inability to urinate Persistent nausea and/or vomiting Extreme swelling or bruising Continued bleeding from incision Increased pain, redness, or drainage from the incision Difficulty swallowing or breathing Muscle cramping or spasms Numbness or tingling in hands or around lips  IF YOU HAVE DISABILITY OR FAMILY LEAVE FORMS, YOU MUST BRING THEM TO THE OFFICE FOR PROCESSING.  PLEASE DO NOT GIVE THEM TO YOUR DOCTOR.  The clinic staff is available to answer your questions during regular business hours.  Please don't hesitate to call and ask to speak to one of the nurses if you have concerns.  [facility] [place] Office: [phone]  For further questions, please visit [email or web address]     Increase activity slowly     Complete by:  As directed             Medication List    TAKE these medications        albuterol 108 (90 Base) MCG/ACT inhaler  Commonly known as:  PROVENTIL HFA;VENTOLIN HFA  Inhale 2 puffs into the lungs 4 (four) times daily.     oxybutynin 5 MG tablet  Commonly known as:  DITROPAN  Take 5 mg by mouth 4 (four) times daily.     oxyCODONE 5 MG immediate release tablet  Commonly known as:  Oxy IR/ROXICODONE  Take 1-2 tablets (5-10 mg total) by mouth every 3 (three) hours as needed for moderate pain or severe pain.     PARoxetine 40 MG tablet  Commonly known as:  PAXIL  Take 40 mg by mouth every morning.     senna-docusate 8.6-50 MG tablet  Commonly known as:  Senokot-S  Take 2 tablets by mouth 2 (two) times daily as needed (for constipation).     traZODone 150 MG tablet  Commonly known as:  DESYREL  Take 300 mg by mouth at bedtime as needed for sleep.     Vitamin D3 2000 units Tabs  Take 2,000 Units by mouth daily.           Follow-up Information    Follow up with Merrie Roof, MD. Schedule an appointment as soon as possible for a visit in 5 days.   Specialty:  General Surgery   Why:  For suture removal and wound check   Contact information:   [address] [place] [address] [phone]       Follow up with [facility].   Why:  home health nurse for dressing management   Contact information:   [address] [place] [address] [phone]       Signed: Earnstine Regal 06/14/2016, 4:10 PM

## 2016-07-22 ENCOUNTER — Emergency Department (HOSPITAL_COMMUNITY): Payer: Non-veteran care

## 2016-07-22 ENCOUNTER — Emergency Department (HOSPITAL_COMMUNITY)
Admission: EM | Admit: 2016-07-22 | Discharge: 2016-07-23 | Disposition: A | Discharge status: Home / Self Care | Payer: Non-veteran care | Attending: Emergency Medicine | Admitting: Emergency Medicine

## 2016-07-22 ENCOUNTER — Encounter (HOSPITAL_COMMUNITY): Payer: Self-pay | Admitting: Emergency Medicine

## 2016-07-22 DIAGNOSIS — Z8546 Personal history of malignant neoplasm of prostate: Secondary | ICD-10-CM | POA: Diagnosis not present

## 2016-07-22 DIAGNOSIS — Z87891 Personal history of nicotine dependence: Secondary | ICD-10-CM | POA: Diagnosis not present

## 2016-07-22 DIAGNOSIS — R339 Retention of urine, unspecified: Secondary | ICD-10-CM | POA: Diagnosis not present

## 2016-07-22 LAB — COMPREHENSIVE METABOLIC PANEL
ALBUMIN: 4.1 g/dL (ref 3.5–5.0)
ALT: 13 U/L — ABNORMAL LOW (ref 17–63)
ANION GAP: 11 (ref 5–15)
AST: 23 U/L (ref 15–41)
Alkaline Phosphatase: 96 U/L (ref 38–126)
BILIRUBIN TOTAL: 0.4 mg/dL (ref 0.3–1.2)
CHLORIDE: 104 mmol/L (ref 101–111)
CO2: 23 mmol/L (ref 22–32)
Calcium: 10.3 mg/dL (ref 8.9–10.3)
Creatinine, Ser: 1.07 mg/dL (ref 0.61–1.24)
GFR calc Af Amer: >60 mL/min (ref >60)
GFR calc non Af Amer: >60 mL/min (ref >60)
GLUCOSE: 103 mg/dL — AB (ref 65–99)
POTASSIUM: 4.1 mmol/L (ref 3.5–5.1)
SODIUM: 138 mmol/L (ref 135–145)
Total Protein: 7.9 g/dL (ref 6.5–8.1)

## 2016-07-22 LAB — CBC
HEMATOCRIT: 35.7 % — AB (ref 39.0–52.0)
HEMOGLOBIN: 10.7 g/dL — AB (ref 13.0–17.0)
MCH: 21.3 pg — AB (ref 26.0–34.0)
MCHC: 30 g/dL (ref 30.0–36.0)
MCV: 71.1 fL — AB (ref 78.0–100.0)
Platelets: 381 10*3/uL (ref 150–400)
RBC: 5.02 MIL/uL (ref 4.22–5.81)
RDW: 21.3 % — AB (ref 11.5–15.5)
WBC: 4.6 10*3/uL (ref 4.0–10.5)

## 2016-07-22 LAB — URINALYSIS, ROUTINE W REFLEX MICROSCOPIC
BILIRUBIN URINE: NEGATIVE
GLUCOSE, UA: NEGATIVE mg/dL
KETONES UR: NEGATIVE mg/dL
NITRITE: NEGATIVE
PH: 6 (ref 5.0–8.0)
Protein, ur: NEGATIVE mg/dL
SPECIFIC GRAVITY, URINE: 1.006 (ref 1.005–1.030)

## 2016-07-22 LAB — URINE MICROSCOPIC-ADD ON

## 2016-07-22 LAB — LIPASE, BLOOD: LIPASE: 15 U/L (ref 11–51)

## 2016-07-22 LAB — POC OCCULT BLOOD, ED: Fecal Occult Bld: NEGATIVE

## 2016-07-22 MED ORDER — LIDOCAINE HCL 2 % EX GEL
1.0000 "application " | Freq: Once | CUTANEOUS | Status: AC
  Administered 2016-07-22: 1 via TOPICAL
  Filled 2016-07-22: qty 20

## 2016-07-22 MED ORDER — IOPAMIDOL (ISOVUE-300) INJECTION 61%
INTRAVENOUS | Status: AC
  Administered 2016-07-22: 100 mL
  Filled 2016-07-22: qty 100

## 2016-07-22 NOTE — ED Provider Notes (Signed)
[name] Provider Note   CSN: [id number] Arrival date & time: 07/22/16  [id number]  First Provider Contact:  None       History   Chief Complaint Chief Complaint  Patient presents with  . Urinary Retention  . Constipation    HPI Roger Savage is a 65 y.o. male.  HPI  History of prostate cancer with urinary retention previously requireing indwelling catheter, recent SBO (now s/p LOA) with healing abdominal wound, presents for urinary retention and decreased Bm.  He has had decreased urination today.  He has had this before and required a catheter in April (4 months ago). He endorses some tenderness in the lower abdomen.  He states he has straining.  Also states he had had 2-3 watery BM a day since his operation.  Today he hasn't had a Bm, and is concerned that he is constipated.  Has nausea, no emesis.  Past Medical History:  Diagnosis Date  . Anxiety   . Depression   . Prostate cancer ([place])   . Seizures ([place])   . Sleep deprivation     Patient Active Problem List   Diagnosis Date Noted  . Small bowel obstruction ([place]) 06/02/2016  . Alcohol abuse 01/12/2014  . Seizure ([place]) 01/10/2014    Past Surgical History:  Procedure Laterality Date  . LAPAROTOMY N/A 06/04/2016   Procedure: EXPLORATORY LAPAROTOMY;  Surgeon: Autumn Messing III, MD;  Location: [facility];  Service: General;  Laterality: N/A;  . PROSTATE SURGERY         Home Medications    Prior to Admission medications   Medication Sig Start Date End Date Taking? Authorizing Provider  albuterol (PROVENTIL HFA;VENTOLIN HFA) 108 (90 BASE) MCG/ACT inhaler Inhale 2 puffs into the lungs 4 (four) times daily.    Historical Provider, MD  Cholecalciferol (VITAMIN D3) 2000 UNITS TABS Take 2,000 Units by mouth daily.    Historical Provider, MD  oxybutynin (DITROPAN) 5 MG tablet Take 5 mg by mouth 4 (four) times daily.    Historical Provider, MD  oxyCODONE (OXY IR/ROXICODONE) 5 MG immediate release tablet Take 1-2 tablets  (5-10 mg total) by mouth every 3 (three) hours as needed for moderate pain or severe pain. 06/10/16   Armandina Gemma, MD  PARoxetine (PAXIL) 40 MG tablet Take 40 mg by mouth every morning.    Historical Provider, MD  senna-docusate (SENOKOT-S) 8.6-50 MG per tablet Take 2 tablets by mouth 2 (two) times daily as needed (for constipation).    Historical Provider, MD  traZODone (DESYREL) 150 MG tablet Take 300 mg by mouth at bedtime as needed for sleep.    Historical Provider, MD    Family History History reviewed. No pertinent family history.  Social History Social History  Substance Use Topics  . Smoking status: Former Research scientist (life sciences)  . Smokeless tobacco: Not on file  . Alcohol use 3.6 oz/week    6 Cans of beer per week     Comment: PT trying to quit drinking - drinks a 6 pack/week     Allergies   Review of patient's allergies indicates no known allergies.   Review of Systems Review of Systems  Constitutional: Negative for chills and fever.  HENT: Negative for ear pain and sore throat.   Eyes: Negative for pain and visual disturbance.  Respiratory: Negative for cough and shortness of breath.   Cardiovascular: Negative for chest pain and palpitations.  Gastrointestinal: Positive for nausea. Negative for abdominal pain and vomiting.  Genitourinary: Positive for difficulty urinating. Negative  for dysuria and hematuria.  Musculoskeletal: Negative for arthralgias and back pain.  Skin: Negative for color change and rash.  Neurological: Negative for seizures and syncope.  All other systems reviewed and are negative.    Physical Exam Updated Vital Signs BP (!) 155/102 (BP Location: Right Arm)   Pulse 92   Temp 98.3 F (36.8 C) (Oral)   Resp 24   SpO2 99%   Physical Exam  Constitutional: He appears well-developed and well-nourished.  HENT:  Head: Normocephalic and atraumatic.  Eyes: Conjunctivae are normal.  Neck: Neck supple.  Cardiovascular: Normal rate and regular rhythm.   No  murmur heard. Pulmonary/Chest: Effort normal and breath sounds normal. No respiratory distress.  Abdominal: Soft. There is no tenderness.  Musculoskeletal: He exhibits no edema.  Neurological: He is alert.  Skin: Skin is warm and dry.  Healing lower abdominal wound.  Psychiatric: He has a normal mood and affect.  Nursing note and vitals reviewed.    ED Treatments / Results  Labs (all labs ordered are listed, but only abnormal results are displayed) Labs Reviewed  CBC - Abnormal; Notable for the following:       Result Value   Hemoglobin 10.7 (*)    HCT 35.7 (*)    MCV 71.1 (*)    MCH 21.3 (*)    RDW 21.3 (*)    All other components within normal limits  LIPASE, BLOOD  COMPREHENSIVE METABOLIC PANEL  URINALYSIS, ROUTINE W REFLEX MICROSCOPIC (NOT AT [facility])    EKG  EKG Interpretation None       Radiology No results found.  Procedures Procedures (including critical care time)  Medications Ordered in ED Medications - No data to display   Initial Impression / Assessment and Plan / ED Course  I have reviewed the triage vital signs and the nursing notes.  Pertinent labs & imaging results that were available during my care of the patient were reviewed by me and considered in my medical decision making (see chart for details).  Clinical Course    Limited Ultrasound of bladder  Performed by Dr. Augustin Coupe Indication: to assess for urinary retention and/or bladder volume after urinary catheretization  Technique:  Low frequency probe utilized in two planes to assess bladder volume in real-time. Findings: Bladder volume 239 (abnormal) Additional findings: no foley bulb seen Images were archived electronically  Regarding urinary retention, was initially 500cc on bladder scan, so foley was placed.  Patient had about 70cc out.  US performed and showed retention of 240cc, and no foley bulb in place.  Attempted to place multiple sizes of foleys, but we were unable.  Therefore,  urology was called and was able to place foley.  Patient will f/u with his urologist in 2 weeks.  Regarding abdominal pain, he has a reassuring abd exam.  Labs unremarkable.  Doubt SBO given normal CT abd/pelvis.  No indication of abscess, either.  Will encourage continued use of laxatives and close f/u.    We have discussed the discharge plan, including the plan for outpatient followup, and strict return precautions, including those that would require calling 911.     Final Clinical Impressions(s) / ED Diagnoses   Final diagnoses:  None    New Prescriptions New Prescriptions   No medications on file     Levada Schilling, MD 07/22/16 [place], MD 07/23/16 0005

## 2016-07-22 NOTE — ED Notes (Signed)
Pt had 500 CC via bladder scan c/o urinary retention and urgency; pt sts hx of similar

## 2016-07-22 NOTE — ED Notes (Signed)
Patient transported to CT 

## 2016-07-22 NOTE — ED Notes (Signed)
MD at bedside preforming bladder US. Bladder is distended on [place] but tip of foley not present. Balloon deflated and foley removed. Charge nurse notified of need for new foley.

## 2016-07-22 NOTE — ED Triage Notes (Signed)
Pt

## 2016-07-22 NOTE — ED Notes (Signed)
Attempted 65F coude catheter insertion.  Coude will not pass, meeting resistance.  MD notified.

## 2016-07-22 NOTE — ED Notes (Signed)
Coude catheter 51F insertion attempted.  Met resistance, unable to pass.

## 2016-07-22 NOTE — ED Notes (Addendum)
Dr. Alyson Ingles at bedside inserting foley catheter. 14 french foley cath inserted using guidewire

## 2016-07-23 NOTE — Consult Note (Addendum)
Urology Consult  Referring physician: Lajean Saver, MD Reason for referral: urinary retention, unable to place foley catheter  Chief Complaint: suprapubic pain  History of Present Illness: Roger Savage is a 65yo with a hx of Prostate cancer s/p Robotic prostatectomy by Dr. Vito Berger at the [facility] who presented to the ER with an inability to urinate and suprapubic pain. Numerous attempts were made int he ER to place a foley by the ER staff which were unsuccessful. His history is complicated by bladder neck contracture which has required multiple dilations. His last dilation was 02/2016. He is currently being considered for an AUS by Dr. Marnee Guarneri at [facility]. He has been unable to urinate for over 12 hours. He has severe, constant, sharp, nonraditiating suprapubic pain. No alleviating/exacerbating events. No associated symptoms  Past Medical History:  Diagnosis Date  . Anxiety   . Depression   . Prostate cancer ([place])   . Seizures ([place])   . Sleep deprivation    Past Surgical History:  Procedure Laterality Date  . LAPAROTOMY N/A 06/04/2016   Procedure: EXPLORATORY LAPAROTOMY;  Surgeon: Autumn Messing III, MD;  Location: [place];  Service: General;  Laterality: N/A;  . PROSTATE SURGERY      Medications: I have reviewed the patient's current medications. Allergies: No Known Allergies  History reviewed. No pertinent family history. Social History:  reports that he has quit smoking. He does not have any smokeless tobacco history on file. He reports that he drinks about 3.6 oz of alcohol per week . He reports that he does not use drugs.  Review of Systems  Gastrointestinal: Positive for abdominal pain.  Genitourinary: Positive for frequency and urgency.  All other systems reviewed and are negative.   Physical Exam:  Vital signs in last 24 hours: Pulse Rate:  [68-101] 68 (08/13 2355) BP: (120-202)/(83-124) 120/90 (08/13 2355) SpO2:  [98 %-100 %] 100 % (08/13 2355) Physical Exam   Constitutional: He is oriented to person, place, and time. He appears well-developed and well-nourished. He appears distressed.  HENT:  Head: Normocephalic and atraumatic.  Eyes: EOM are normal. Pupils are equal, round, and reactive to light.  Neck: Normal range of motion. No thyromegaly present.  Respiratory: Effort normal. No respiratory distress.  GI: Soft. He exhibits no distension and no mass. There is no tenderness. There is no rebound and no guarding. Hernia confirmed negative in the right inguinal area and confirmed negative in the left inguinal area.  Palpable bladder  Genitourinary: Testes normal and penis normal. Right testis shows no mass, no swelling and no tenderness. Right testis is descended. Cremasteric reflex is not absent on the right side. Left testis shows no mass, no swelling and no tenderness. Left testis is descended. Cremasteric reflex is not absent on the left side. Circumcised. No penile tenderness.  Musculoskeletal: Normal range of motion. He exhibits no edema.  Neurological: He is alert and oriented to person, place, and time.  Skin: Skin is warm and dry.  Psychiatric: He has a normal mood and affect. His behavior is normal. Judgment and thought content normal.    Laboratory Data:  Results for orders placed or performed during the hospital encounter of 07/22/16 (from the past 72 hour(s))  Lipase, blood     Status: None   Collection Time: 07/22/16  7:37 PM  Result Value Ref Range   Lipase 15 11 - 51 U/L  Comprehensive metabolic panel     Status: Abnormal   Collection Time: 07/22/16  7:37 PM  Result  Value Ref Range   Sodium 138 135 - 145 mmol/L   Potassium 4.1 3.5 - 5.1 mmol/L   Chloride 104 101 - 111 mmol/L   CO2 23 22 - 32 mmol/L   Glucose, Bld 103 (H) 65 - 99 mg/dL   BUN <5 (L) 6 - 20 mg/dL    Comment: RESULT REPEATED AND VERIFIED   Creatinine, Ser 1.07 0.61 - 1.24 mg/dL   Calcium 10.3 8.9 - 10.3 mg/dL   Total Protein 7.9 6.5 - 8.1 g/dL   Albumin 4.1  3.5 - 5.0 g/dL   AST 23 15 - 41 U/L   ALT 13 (L) 17 - 63 U/L   Alkaline Phosphatase 96 38 - 126 U/L   Total Bilirubin 0.4 0.3 - 1.2 mg/dL   GFR calc non Af Amer >60 >60 mL/min   GFR calc Af Amer >60 >60 mL/min    Comment: (NOTE) The eGFR has been calculated using the CKD EPI equation. This calculation has not been validated in all clinical situations. eGFR's persistently <60 mL/min signify possible Chronic Kidney Disease.    Anion gap 11 5 - 15  CBC     Status: Abnormal   Collection Time: 07/22/16  7:37 PM  Result Value Ref Range   WBC 4.6 4.0 - 10.5 K/uL   RBC 5.02 4.22 - 5.81 MIL/uL   Hemoglobin 10.7 (L) 13.0 - 17.0 g/dL   HCT 35.7 (L) 39.0 - 52.0 %   MCV 71.1 (L) 78.0 - 100.0 fL   MCH 21.3 (L) 26.0 - 34.0 pg   MCHC 30.0 30.0 - 36.0 g/dL   RDW 21.3 (H) 11.5 - 15.5 %   Platelets 381 150 - 400 K/uL  Urinalysis, Routine w reflex microscopic     Status: Abnormal   Collection Time: 07/22/16  7:56 PM  Result Value Ref Range   Color, Urine YELLOW YELLOW   APPearance CLOUDY (A) CLEAR   Specific Gravity, Urine 1.006 1.005 - 1.030   pH 6.0 5.0 - 8.0   Glucose, UA NEGATIVE NEGATIVE mg/dL   Hgb urine dipstick MODERATE (A) NEGATIVE   Bilirubin Urine NEGATIVE NEGATIVE   Ketones, ur NEGATIVE NEGATIVE mg/dL   Protein, ur NEGATIVE NEGATIVE mg/dL   Nitrite NEGATIVE NEGATIVE   Leukocytes, UA TRACE (A) NEGATIVE  Urine microscopic-add on     Status: Abnormal   Collection Time: 07/22/16  7:56 PM  Result Value Ref Range   Squamous Epithelial / LPF 0-5 (A) NONE SEEN   WBC, UA 0-5 0 - 5 WBC/hpf   RBC / HPF 6-30 0 - 5 RBC/hpf   Bacteria, UA RARE (A) NONE SEEN  POC occult blood, ED     Status: None   Collection Time: 07/22/16  8:12 PM  Result Value Ref Range   Fecal Occult Bld NEGATIVE NEGATIVE   No results found for this or any previous visit (from the past 240 hour(s)). Creatinine:  Recent Labs  07/22/16 1937  CREATININE 1.07   Baseline Creatinine: 1  Foley Catheter  Placemenet--Complex  Patient was prepped and draped in the usual sterile fashion using betadine solution. 2% viscous lidocaine was inserted per urethra. A 0.038 sensor was advanced per urethra without significant resistance or recoiling. Upon passage through the bladder neck with the wire urine was noted to emanate from the urethral meatus. A 14 Fr catheter was placed via the Blitz technique and passed easily with immediate return of >8000 cc of clear, amber colored urine without clot. 10 cc sterile water  was placed in the balloon port.    Impression/Assessment:  65yo with bladder neck contracture and urinary retention  Plan:  1. Please continue foley catheter to straight drain. The patient is to keep his scheduled appointment at [facility] with Dr. Marnee Guarneri on 8/29 for possible voiding trial  Nicolette Bang 07/23/2016, 9:38 PM

## 2016-08-18 ENCOUNTER — Emergency Department (HOSPITAL_COMMUNITY)
Admission: EM | Admit: 2016-08-18 | Discharge: 2016-08-18 | Disposition: A | Discharge status: Home / Self Care | Payer: Non-veteran care | Attending: Emergency Medicine | Admitting: Emergency Medicine

## 2016-08-18 ENCOUNTER — Encounter (HOSPITAL_COMMUNITY): Payer: Self-pay

## 2016-08-18 DIAGNOSIS — Z79899 Other long term (current) drug therapy: Secondary | ICD-10-CM | POA: Diagnosis not present

## 2016-08-18 DIAGNOSIS — R339 Retention of urine, unspecified: Secondary | ICD-10-CM | POA: Diagnosis present

## 2016-08-18 DIAGNOSIS — Z8546 Personal history of malignant neoplasm of prostate: Secondary | ICD-10-CM | POA: Insufficient documentation

## 2016-08-18 DIAGNOSIS — Z87891 Personal history of nicotine dependence: Secondary | ICD-10-CM | POA: Diagnosis not present

## 2016-08-18 DIAGNOSIS — R338 Other retention of urine: Secondary | ICD-10-CM

## 2016-08-18 LAB — URINALYSIS, ROUTINE W REFLEX MICROSCOPIC
Bilirubin Urine: NEGATIVE
GLUCOSE, UA: NEGATIVE mg/dL
KETONES UR: NEGATIVE mg/dL
LEUKOCYTES UA: NEGATIVE
Nitrite: NEGATIVE
PH: 5.5 (ref 5.0–8.0)
PROTEIN: NEGATIVE mg/dL
Specific Gravity, Urine: 1.013 (ref 1.005–1.030)

## 2016-08-18 LAB — URINE MICROSCOPIC-ADD ON

## 2016-08-18 NOTE — ED Notes (Addendum)
Bladder scan shows 753ml or urine in bladder.

## 2016-08-18 NOTE — ED Provider Notes (Signed)
[place] DEPT Provider Note   CSN: [id number] Arrival date & time: 08/18/16  1417   History   Chief Complaint Chief Complaint  Patient presents with  . Urinary Retention    HPI Roger Savage is a 65 y.o. male.  The history is provided by the patient and medical records.   65 yo M with hx prostate ca s/p surgery several years ago complicated by recurrent obstruction requiring foley, seizures, SBO s/p ex lap/LOA 05/2016, anxiety, depression presenting with complaint of urinary retention. Onset yesterday. Worsening since. Now severe. A recurrent problem. This is similar to prior episodes. Last made tiny amount of urine this morning. Associated with increased abdominal distension and pain/fullness sensation and nausea. Pt last had foley placed for same issue here 8/13, which required use of wire by Urology, and had this removed last week.    Past Medical History:  Diagnosis Date  . Anxiety   . Depression   . Prostate cancer ([place])   . Seizures ([place])   . Sleep deprivation     Patient Active Problem List   Diagnosis Date Noted  . Small bowel obstruction ([place]) 06/02/2016  . Alcohol abuse 01/12/2014  . Seizure ([place]) 01/10/2014    Past Surgical History:  Procedure Laterality Date  . LAPAROTOMY N/A 06/04/2016   Procedure: EXPLORATORY LAPAROTOMY;  Surgeon: Autumn Messing III, MD;  Location: [place];  Service: General;  Laterality: N/A;  . PROSTATE SURGERY       Home Medications    Prior to Admission medications   Medication Sig Start Date End Date Taking? Authorizing Provider  albuterol (PROVENTIL HFA;VENTOLIN HFA) 108 (90 BASE) MCG/ACT inhaler Inhale 2 puffs into the lungs 4 (four) times daily.    Historical Provider, MD  Cholecalciferol (VITAMIN D3) 2000 UNITS TABS Take 2,000 Units by mouth daily.    Historical Provider, MD  HYDROcodone-acetaminophen (NORCO/VICODIN) 5-325 MG tablet Take 1 tablet by mouth every 6 (six) hours as needed for moderate pain.  03/28/16   Historical  Provider, MD  oxybutynin (DITROPAN) 5 MG tablet Take 5 mg by mouth 4 (four) times daily.    Historical Provider, MD  oxyCODONE (OXY IR/ROXICODONE) 5 MG immediate release tablet Take 1-2 tablets (5-10 mg total) by mouth every 3 (three) hours as needed for moderate pain or severe pain. Patient not taking: Reported on 07/22/2016 06/10/16   Armandina Gemma, MD  PARoxetine (PAXIL) 40 MG tablet Take 40 mg by mouth every morning.    Historical Provider, MD  sulfamethoxazole-trimethoprim (BACTRIM DS,SEPTRA DS) 800-160 MG tablet Take 1 tablet by mouth 2 (two) times daily.    Historical Provider, MD  traZODone (DESYREL) 150 MG tablet Take 300 mg by mouth at bedtime as needed for sleep.    Historical Provider, MD    Family History No family history on file.  Social History Social History  Substance Use Topics  . Smoking status: Former Research scientist (life sciences)  . Smokeless tobacco: Never Used  . Alcohol use 3.6 oz/week    6 Cans of beer per week     Comment: PT trying to quit drinking - drinks a 6 pack/week     Allergies   Review of patient's allergies indicates no known allergies.   Review of Systems Review of Systems  Constitutional: Negative for fever.  HENT: Negative for ear pain.   Respiratory: Negative for shortness of breath.   Cardiovascular: Negative for chest pain.  Gastrointestinal: Positive for abdominal distention, abdominal pain and nausea. Negative for vomiting.  Genitourinary: Positive for decreased  urine volume and difficulty urinating. Negative for dysuria and hematuria.  Musculoskeletal: Negative for back pain.  Psychiatric/Behavioral: The patient is nervous/anxious.   All other systems reviewed and are negative.   Physical Exam Updated Vital Signs BP 171/91 (BP Location: Left Arm)   Pulse 93   Temp 97.6 F (36.4 C) (Oral)   Resp 22   SpO2 100%   Physical Exam  Constitutional: He appears well-developed and well-nourished.  Appears uncomfortable, rolled on side holding abd   HENT:    Head: Normocephalic and atraumatic.  Eyes: Conjunctivae are normal.  Neck: Neck supple.  Cardiovascular: Normal rate and regular rhythm.   Pulmonary/Chest: Effort normal and breath sounds normal. No respiratory distress.  Abdominal: Soft. There is tenderness.  Palpable enlarged bladder and fullness in suprapubic region, TTP over this area, no overt CVAT  Musculoskeletal: He exhibits no edema.  Neurological: He is alert.  Skin: Skin is warm and dry.  Psychiatric: He has a normal mood and affect.  Nursing note and vitals reviewed.    ED Treatments / Results  Labs (all labs ordered are listed, but only abnormal results are displayed) Labs Reviewed  URINALYSIS, ROUTINE W REFLEX MICROSCOPIC (NOT AT [facility])    EKG  EKG Interpretation None       Radiology No results found.  Procedures Procedures (including critical care time)   Medications Ordered in ED Medications - No data to display   Initial Impression / Assessment and Plan / ED Course  I have reviewed the triage vital signs and the nursing notes.  Pertinent labs & imaging results that were available during my care of the patient were reviewed by me and considered in my medical decision making (see chart for details).  Clinical Course    65 yo M with hx prostate ca s/p surgery several years ago complicated by recurrent obstruction requiring foley, seizures, SBO s/p ex lap/LOA 05/2016, anxiety, depression presenting with complaint of urinary retention, as above. AF with stable vitals. Abd distended with large palpable bladder. Bladder scan > 700 cc. 14 gauge foley placed by Dr Ashok Cordia at bedside with return of >700 cc urine. Sx alleviated after. Pt already followed closely by Urology at [facility], with plans upcoming for a procedure to address these recurrent retention issues. Given leg bag and discharged home with plan for f/u with his Urologist. Return precautions discussed.   Case discussed with Dr. Ashok Cordia, who oversaw  management of this patient.    Final Clinical Impressions(s) / ED Diagnoses   Final diagnoses:  Acute urinary retention    New Prescriptions New Prescriptions   No medications on file     Ivin Booty, MD 08/18/16 [place], MD 08/18/16 2009

## 2016-08-18 NOTE — ED Notes (Signed)
Leg bag placed on pt , pt to call Urology in [place] on Monday

## 2016-08-18 NOTE — ED Notes (Signed)
MD at bedside able to place foley cath

## 2016-08-18 NOTE — ED Notes (Signed)
Pt resting now no pain

## 2016-08-18 NOTE — ED Triage Notes (Signed)
Pt here with c/o urinary retention "im stopped up." Last urinated yesterday. Pt reports lower abdominal pain. Was seen here last week for same issue and discharged with a foley catheter but it was removed "a week later." "Prostate removed 4 years ago."

## 2016-08-18 NOTE — ED Notes (Signed)
Attempted 73F coude catheter and unable to pass

## 2016-08-25 ENCOUNTER — Encounter (HOSPITAL_COMMUNITY): Payer: Self-pay

## 2016-08-25 ENCOUNTER — Emergency Department (HOSPITAL_COMMUNITY)
Admission: EM | Admit: 2016-08-25 | Discharge: 2016-08-26 | Disposition: A | Discharge status: Home / Self Care | Payer: Non-veteran care | Attending: Emergency Medicine | Admitting: Emergency Medicine

## 2016-08-25 DIAGNOSIS — R109 Unspecified abdominal pain: Secondary | ICD-10-CM | POA: Diagnosis present

## 2016-08-25 DIAGNOSIS — Z8546 Personal history of malignant neoplasm of prostate: Secondary | ICD-10-CM | POA: Insufficient documentation

## 2016-08-25 DIAGNOSIS — Z87891 Personal history of nicotine dependence: Secondary | ICD-10-CM | POA: Insufficient documentation

## 2016-08-25 DIAGNOSIS — R140 Abdominal distension (gaseous): Secondary | ICD-10-CM | POA: Diagnosis not present

## 2016-08-25 DIAGNOSIS — R1084 Generalized abdominal pain: Secondary | ICD-10-CM | POA: Insufficient documentation

## 2016-08-25 DIAGNOSIS — Z79899 Other long term (current) drug therapy: Secondary | ICD-10-CM | POA: Insufficient documentation

## 2016-08-25 LAB — COMPREHENSIVE METABOLIC PANEL
ALK PHOS: 86 U/L (ref 38–126)
ALT: 13 U/L — ABNORMAL LOW (ref 17–63)
ANION GAP: 7 (ref 5–15)
AST: 23 U/L (ref 15–41)
Albumin: 4.1 g/dL (ref 3.5–5.0)
BILIRUBIN TOTAL: 0.4 mg/dL (ref 0.3–1.2)
BUN: 8 mg/dL (ref 6–20)
CALCIUM: 9.4 mg/dL (ref 8.9–10.3)
CO2: 21 mmol/L — ABNORMAL LOW (ref 22–32)
CREATININE: 1.22 mg/dL (ref 0.61–1.24)
Chloride: 109 mmol/L (ref 101–111)
Glucose, Bld: 97 mg/dL (ref 65–99)
Potassium: 4 mmol/L (ref 3.5–5.1)
Sodium: 137 mmol/L (ref 135–145)
TOTAL PROTEIN: 7.8 g/dL (ref 6.5–8.1)

## 2016-08-25 LAB — CBC
HCT: 37.7 % — ABNORMAL LOW (ref 39.0–52.0)
HEMOGLOBIN: 11.3 g/dL — AB (ref 13.0–17.0)
MCH: 22.6 pg — ABNORMAL LOW (ref 26.0–34.0)
MCHC: 30 g/dL (ref 30.0–36.0)
MCV: 75.2 fL — ABNORMAL LOW (ref 78.0–100.0)
PLATELETS: 270 10*3/uL (ref 150–400)
RBC: 5.01 MIL/uL (ref 4.22–5.81)
RDW: 20.1 % — ABNORMAL HIGH (ref 11.5–15.5)
WBC: 4.5 10*3/uL (ref 4.0–10.5)

## 2016-08-25 LAB — LIPASE, BLOOD: Lipase: 20 U/L (ref 11–51)

## 2016-08-25 NOTE — ED Notes (Signed)
Physician at the bedside.  

## 2016-08-25 NOTE — ED Provider Notes (Addendum)
[place] DEPT Provider Note   CSN: [id number] Arrival date & time: 08/25/16  [id number]  By signing my name below, I, Roger Savage, attest that this documentation has been prepared under the direction and in the presence of Varney Biles, MD. Electronically Signed: Gwenlyn Savage, ED Scribe. 08/25/16. 1:40 AM.   History   Chief Complaint Chief Complaint  Patient presents with  . Abdominal Pain   HPI  HPI Comments: Roger Savage is a 65 y.o. male with PMHx of Prostate Cancer and a Small bowel obstruction who presents to the Emergency Department complaining of gradual onset and worsening , constant abdominal distension onset 1 week PTA. Pt had surgery for bowel obstruction in July and was advised to return to ED if he began experiencing abdominal distention. Pt states it was exacerbated when he was unable to urinate. Pt reports associated intermittent abdominal pain, 1x daily diarrhea, vomiting. He reports his last bowel movement was today. Had a foley catheter placed on 08/18/2016, and it's draining fine. Denies appetite change.  Past Medical History:  Diagnosis Date  . Anxiety   . Depression   . Prostate cancer ([place])   . Seizures ([place])   . Sleep deprivation     Patient Active Problem List   Diagnosis Date Noted  . Small bowel obstruction ([place]) 06/02/2016  . Alcohol abuse 01/12/2014  . Seizure ([place]) 01/10/2014    Past Surgical History:  Procedure Laterality Date  . LAPAROTOMY N/A 06/04/2016   Procedure: EXPLORATORY LAPAROTOMY;  Surgeon: Autumn Messing III, MD;  Location: [place];  Service: General;  Laterality: N/A;  . PROSTATE SURGERY       Home Medications    Prior to Admission medications   Medication Sig Start Date End Date Taking? Authorizing Provider  albuterol (PROVENTIL HFA;VENTOLIN HFA) 108 (90 BASE) MCG/ACT inhaler Inhale 2 puffs into the lungs 4 (four) times daily.   Yes Historical Provider, MD  Cholecalciferol (VITAMIN D3) 2000 UNITS TABS Take 2,000 Units by mouth  daily.   Yes Historical Provider, MD  HYDROcodone-acetaminophen (NORCO/VICODIN) 5-325 MG tablet Take 1 tablet by mouth every 6 (six) hours as needed for moderate pain.  03/28/16  Yes Historical Provider, MD  oxybutynin (DITROPAN) 5 MG tablet Take 5 mg by mouth 4 (four) times daily.   Yes Historical Provider, MD  PARoxetine (PAXIL) 40 MG tablet Take 40 mg by mouth every morning.   Yes Historical Provider, MD  traZODone (DESYREL) 150 MG tablet Take 300 mg by mouth at bedtime as needed for sleep.   Yes Historical Provider, MD    Family History History reviewed. No pertinent family history.  Social History Social History  Substance Use Topics  . Smoking status: Former Research scientist (life sciences)  . Smokeless tobacco: Never Used  . Alcohol use 3.6 oz/week    6 Cans of beer per week     Comment: PT trying to quit drinking - drinks a 6 pack/week     Allergies   Review of patient's allergies indicates no known allergies.   Review of Systems Review of Systems A complete 10 system review of systems was obtained and all systems are negative except as noted in the HPI and PMH.    Physical Exam Updated Vital Signs BP 148/90   Pulse (!) 48   Temp 98 F (36.7 C) (Oral)   Resp 16   Ht 5\' 9"  (1.753 m)   Wt 155 lb (70.3 kg)   SpO2 98%   BMI 22.89 kg/m   Physical Exam  Constitutional:  He is oriented to person, place, and time. He appears well-developed and well-nourished.  HENT:  Head: Normocephalic and atraumatic.  Eyes: Conjunctivae are normal.  Cardiovascular: Normal rate and regular rhythm.   Pulmonary/Chest: Effort normal and breath sounds normal. No respiratory distress. He has no wheezes. He has no rales.  Abdominal: Bowel sounds are normal. He exhibits distension. There is tenderness (over the lower abdominal quadrants). There is no rebound and no guarding.  Neurological: He is alert and oriented to person, place, and time.  Skin: Skin is warm and dry.  Surgical wound site appears to be healing  well No purulent drainage    ED Treatments / Results  DIAGNOSTIC STUDIES: Oxygen Saturation is 100% on RA, normal by my interpretation.    COORDINATION OF CARE: 11:34 PM Discussed treatment plan with pt at bedside which includes lab work and pt agreed to plan.  Labs (all labs ordered are listed, but only abnormal results are displayed) Labs Reviewed  COMPREHENSIVE METABOLIC PANEL - Abnormal; Notable for the following:       Result Value   CO2 21 (*)    ALT 13 (*)    All other components within normal limits  CBC - Abnormal; Notable for the following:    Hemoglobin 11.3 (*)    HCT 37.7 (*)    MCV 75.2 (*)    MCH 22.6 (*)    RDW 20.1 (*)    All other components within normal limits  URINALYSIS, ROUTINE W REFLEX MICROSCOPIC (NOT AT [facility]) - Abnormal; Notable for the following:    APPearance CLOUDY (*)    Protein, ur 30 (*)    Leukocytes, UA SMALL (*)    All other components within normal limits  URINE MICROSCOPIC-ADD ON - Abnormal; Notable for the following:    Squamous Epithelial / LPF 0-5 (*)    Bacteria, UA RARE (*)    All other components within normal limits  LIPASE, BLOOD    EKG  EKG Interpretation None       Radiology Ct Abdomen Pelvis W Contrast  Result Date: 08/26/2016 CLINICAL DATA:  Abdominal pain and distension. Bowel obstruction requiring surgery last month. EXAM: CT ABDOMEN AND PELVIS WITH CONTRAST TECHNIQUE: Multidetector CT imaging of the abdomen and pelvis was performed using the standard protocol following bolus administration of intravenous contrast. CONTRAST:  100 cc Isovue-300 IV COMPARISON:  Most recent CT 07/22/2016 FINDINGS: Lower chest: No pleural effusion or focal airspace disease. Scattered scarring or atelectasis. Coronary artery calcifications. Hepatobiliary: No focal liver abnormality is seen. No gallstones, gallbladder wall thickening, or biliary dilatation. Pancreas: No ductal dilatation or inflammation. Spleen: Normal in size without focal  abnormality. Adrenals/Urinary Tract: Normal adrenal glands. Mild prominence of the left ureter without frank hydronephrosis. No perinephric edema. Tiny hypodensity in the lower right kidney. Symmetric renal enhancement and excretion on delayed phase imaging. Urinary bladder is decompressed by Foley catheter. There is diffuse bladder wall thickening and enhancement. Stomach/Bowel: Stomach is decompressed. Air throughout normal caliber small and large bowel without evidence of obstruction. No small bowel inflammation. Umbilical hernia contains nonobstructing noninflamed small bowel, similar to prior. Small volume of stool throughout the colon. No colonic inflammation. Normal appendix. Vascular/Lymphatic: Aortic atherosclerosis. No enlarged abdominal or pelvic lymph nodes. Reproductive: Prostate gland not seen. Other: No free intra-abdominal air. No free fluid. No intra-abdominal abscess. Umbilical hernia contains nonobstructed noninflamed small bowel. Postsurgical change of the anterior abdominal wall. No subcutaneous fluid collection. Musculoskeletal: There are no acute or suspicious osseous abnormalities.  IMPRESSION: 1. No bowel obstruction. Umbilical hernia contains noninflamed nonobstructed small bowel. 2. Bladder decompressed by Foley catheter, however bladder wall thickening and enhancement are suspicious for or cystitis. Electronically Signed   By: Jeb Levering M.D.   On: 08/26/2016 03:20   Dg Abd Acute W/chest  Result Date: 08/26/2016 CLINICAL DATA:  Abdominal pain and distension. History of small bowel obstruction with surgery 1 month prior. EXAM: DG ABDOMEN ACUTE W/ 1V CHEST COMPARISON:  CT 07/22/2016 FINDINGS: Lungs symmetrically inflated and clear. Cardiomediastinal contours are normal. No free intra-abdominal air. Air throughout normal caliber small and large bowel loops throughout the abdomen. A few scattered air-fluid levels. No radiopaque calculi. No acute osseous abnormality. IMPRESSION:  Increased air throughout normal caliber bowel loops throughout the abdomen, nonspecific. This may be ileus, enteritis, or less likely early small bowel obstruction. Electronically Signed   By: Jeb Levering M.D.   On: 08/26/2016 01:38    Procedures Procedures (including critical care time)  Medications Ordered in ED Medications  iopamidol (ISOVUE-300) 61 % injection (100 mLs  Contrast Given 08/26/16 0254)     Initial Impression / Assessment and Plan / ED Course  I have reviewed the triage vital signs and the nursing notes.  Pertinent labs & imaging results that were available during my care of the patient were reviewed by me and considered in my medical decision making (see chart for details).    Clinical Course  Comment By Time  AAS was equivocal. CT abd ordered, and is neg for obstruction. Will advise clear liquid diet and pcp f/u. Varney Biles, MD 09/17 [phone]   PT comes in with cc of abd pain. Pt has mild lower quadrant abd tenderness and abd distention. No peritoneal signs on exam. + BM per pt. Unlikely SBO. AAS ordered to see If there is any signs of obstruction. With an indwelling foley catheter - UTI / cystitis is also possible etiology.  Final Clinical Impressions(s) / ED Diagnoses   Final diagnoses:  Generalized abdominal pain  Abdominal distension    New Prescriptions New Prescriptions   No medications on file       Varney Biles, MD 08/26/16 [place], MD 08/26/16 [phone]

## 2016-08-25 NOTE — ED Triage Notes (Signed)
Pt reports he had surgery for a bowel obstruction last month, he reports abd pain as well as distention. Pt reports LBM was today. Pt also reporting he needs to have his foley changed.

## 2016-08-26 ENCOUNTER — Emergency Department (HOSPITAL_COMMUNITY): Payer: Non-veteran care

## 2016-08-26 ENCOUNTER — Encounter (HOSPITAL_COMMUNITY): Payer: Self-pay | Admitting: Radiology

## 2016-08-26 LAB — URINALYSIS, ROUTINE W REFLEX MICROSCOPIC
Bilirubin Urine: NEGATIVE
Glucose, UA: NEGATIVE mg/dL
HGB URINE DIPSTICK: NEGATIVE
Ketones, ur: NEGATIVE mg/dL
Nitrite: NEGATIVE
Protein, ur: 30 mg/dL — AB
SPECIFIC GRAVITY, URINE: 1.017 (ref 1.005–1.030)
pH: 6 (ref 5.0–8.0)

## 2016-08-26 LAB — URINE MICROSCOPIC-ADD ON: RBC / HPF: NONE SEEN RBC/hpf (ref 0–5)

## 2016-08-26 MED ORDER — TRAMADOL HCL 50 MG PO TABS: 100.0000 mg | ORAL_TABLET | Freq: Four times a day (QID) | ORAL | 0 refills | Status: AC | PRN

## 2016-08-26 MED ORDER — IOPAMIDOL (ISOVUE-300) INJECTION 61%
INTRAVENOUS | Status: AC
  Administered 2016-08-26: 100 mL
  Filled 2016-08-26: qty 100

## 2016-08-26 NOTE — ED Notes (Signed)
Taken to CT at this time. 

## 2016-08-26 NOTE — Discharge Instructions (Signed)
We saw you in the ER for the abdominal pain. All of our results are normal, including all labs and imaging. Kidney function is fine as well. We are not sure what is causing your abdominal pain, and recommend that you see your primary care doctor within 2-3 days for further evaluation. If your symptoms get worse, return to the ER. Take the pain meds and nausea meds as prescribed.   

## 2016-10-10 ENCOUNTER — Other Ambulatory Visit: Payer: Self-pay | Admitting: General Surgery

## 2016-10-10 DIAGNOSIS — K432 Incisional hernia without obstruction or gangrene: Secondary | ICD-10-CM

## 2016-10-16 ENCOUNTER — Ambulatory Visit
Admission: RE | Admit: 2016-10-16 | Discharge: 2016-10-16 | Disposition: A | Discharge status: Home / Self Care | Payer: Non-veteran care | Source: Ambulatory Visit | Attending: General Surgery | Admitting: General Surgery

## 2016-10-16 DIAGNOSIS — K432 Incisional hernia without obstruction or gangrene: Secondary | ICD-10-CM

## 2016-10-16 MED ORDER — IOPAMIDOL (ISOVUE-300) INJECTION 61%
100.0000 mL | Freq: Once | INTRAVENOUS | Status: AC | PRN
  Administered 2016-10-16: 100 mL via INTRAVENOUS

## 2016-12-08 ENCOUNTER — Encounter (HOSPITAL_COMMUNITY): Payer: Self-pay | Admitting: *Deleted

## 2016-12-08 ENCOUNTER — Ambulatory Visit (HOSPITAL_COMMUNITY)
Admission: EM | Admit: 2016-12-08 | Discharge: 2016-12-08 | Disposition: A | Discharge status: Home / Self Care | Payer: Non-veteran care | Attending: Emergency Medicine | Admitting: Emergency Medicine

## 2016-12-08 DIAGNOSIS — Z87891 Personal history of nicotine dependence: Secondary | ICD-10-CM | POA: Diagnosis not present

## 2016-12-08 DIAGNOSIS — F329 Major depressive disorder, single episode, unspecified: Secondary | ICD-10-CM | POA: Insufficient documentation

## 2016-12-08 DIAGNOSIS — Z8546 Personal history of malignant neoplasm of prostate: Secondary | ICD-10-CM | POA: Insufficient documentation

## 2016-12-08 DIAGNOSIS — N390 Urinary tract infection, site not specified: Secondary | ICD-10-CM

## 2016-12-08 DIAGNOSIS — R569 Unspecified convulsions: Secondary | ICD-10-CM | POA: Insufficient documentation

## 2016-12-08 DIAGNOSIS — F419 Anxiety disorder, unspecified: Secondary | ICD-10-CM | POA: Diagnosis not present

## 2016-12-08 LAB — POCT URINALYSIS DIP (DEVICE)
Bilirubin Urine: NEGATIVE
GLUCOSE, UA: NEGATIVE mg/dL
Ketones, ur: NEGATIVE mg/dL
NITRITE: POSITIVE — AB
Protein, ur: 100 mg/dL — AB
Specific Gravity, Urine: >=1.03 (ref 1.005–1.030)
UROBILINOGEN UA: 0.2 mg/dL (ref 0.0–1.0)
pH: 5.5 (ref 5.0–8.0)

## 2016-12-08 MED ORDER — CIPROFLOXACIN HCL 500 MG PO TABS: 500.0000 mg | ORAL_TABLET | Freq: Two times a day (BID) | ORAL | 0 refills | Status: AC

## 2016-12-08 MED ORDER — PHENAZOPYRIDINE HCL 100 MG PO TABS: 100.0000 mg | ORAL_TABLET | Freq: Three times a day (TID) | ORAL | 0 refills | Status: AC | PRN

## 2016-12-08 NOTE — ED Triage Notes (Signed)
Patient states history of frequent UTIs. Patient states symptoms started yesterday, mild abdominal pain frequent urination.

## 2016-12-08 NOTE — ED Provider Notes (Signed)
CSN: [id number]     Arrival date & time 12/08/16  1541 History   None    Chief Complaint  Patient presents with  . Recurrent UTI   (Consider location/radiation/quality/duration/timing/severity/associated sxs/prior Treatment) 65 y.o. \male presents with urinary pain, increase in frequency and decrease in urine output X 3 days.  Condition is acute on chronic in nature. Condition is made better by nothing. Condition is made worse by nothing. Patient denies any treatment prior to there arrival at this facility.        Past Medical History:  Diagnosis Date  . Anxiety   . Depression   . Prostate cancer ([place])   . Seizures ([place])   . Sleep deprivation    Past Surgical History:  Procedure Laterality Date  . LAPAROTOMY N/A 06/04/2016   Procedure: EXPLORATORY LAPAROTOMY;  Surgeon: Autumn Messing III, MD;  Location: [place];  Service: General;  Laterality: N/A;  . PROSTATE SURGERY     History reviewed. No pertinent family history. Social History  Substance Use Topics  . Smoking status: Former Research scientist (life sciences)  . Smokeless tobacco: Never Used  . Alcohol use 3.6 oz/week    6 Cans of beer per week     Comment: PT trying to quit drinking - drinks a 6 pack/week    Review of Systems  Constitutional: Negative for chills and fever.  HENT: Negative for ear pain and sore throat.   Eyes: Negative for pain and visual disturbance.  Respiratory: Negative for cough and shortness of breath.   Cardiovascular: Negative for chest pain and palpitations.  Gastrointestinal: Negative for abdominal pain and vomiting.  Genitourinary: Positive for decreased urine volume, difficulty urinating, dysuria and urgency. Negative for flank pain.  Musculoskeletal: Negative for arthralgias and back pain.  Skin: Negative for color change and rash.  Neurological: Negative for seizures and syncope.  All other systems reviewed and are negative.   Allergies  Patient has no known allergies.  Home Medications   Prior to Admission  medications   Medication Sig Start Date End Date Taking? Authorizing Provider  albuterol (PROVENTIL HFA;VENTOLIN HFA) 108 (90 BASE) MCG/ACT inhaler Inhale 2 puffs into the lungs 4 (four) times daily.   Yes Historical Provider, MD  Cholecalciferol (VITAMIN D3) 2000 UNITS TABS Take 2,000 Units by mouth daily.   Yes Historical Provider, MD  HYDROcodone-acetaminophen (NORCO/VICODIN) 5-325 MG tablet Take 1 tablet by mouth every 6 (six) hours as needed for moderate pain.  03/28/16  Yes Historical Provider, MD  oxybutynin (DITROPAN) 5 MG tablet Take 5 mg by mouth 4 (four) times daily.   Yes Historical Provider, MD  PARoxetine (PAXIL) 40 MG tablet Take 40 mg by mouth every morning.   Yes Historical Provider, MD  traMADol (ULTRAM) 50 MG tablet Take 2 tablets (100 mg total) by mouth every 6 (six) hours as needed. 08/26/16  Yes Varney Biles, MD  traZODone (DESYREL) 150 MG tablet Take 300 mg by mouth at bedtime as needed for sleep.   Yes Historical Provider, MD  ciprofloxacin (CIPRO) 500 MG tablet Take 1 tablet (500 mg total) by mouth every 12 (twelve) hours. 12/08/16 12/18/16  Jacqualine Mau, NP  phenazopyridine (PYRIDIUM) 100 MG tablet Take 1 tablet (100 mg total) by mouth 3 (three) times daily as needed for pain. 12/08/16   Jacqualine Mau, NP   Meds Ordered and Administered this Visit  Medications - No data to display  BP 163/87 (BP Location: Left Arm)   Pulse 97   Temp 98.6 F (37  C) (Oral)   Resp 19   SpO2 99%  No data found.   Physical Exam  Constitutional: He is oriented to person, place, and time. He appears well-developed and well-nourished.  HENT:  Head: Normocephalic.  Neck: Normal range of motion.  Pulmonary/Chest: Effort normal.  Musculoskeletal: Normal range of motion.  Neurological: He is alert and oriented to person, place, and time.  Skin: Skin is dry.  Psychiatric: He has a normal mood and affect.  Nursing note and vitals reviewed.   Urgent Care Course    Clinical Course     Procedures (including critical care time)  Labs Review Labs Reviewed  POCT URINALYSIS DIP (DEVICE) - Abnormal; Notable for the following:       Result Value   Hgb urine dipstick LARGE (*)    Protein, ur 100 (*)    Nitrite POSITIVE (*)    Leukocytes, UA LARGE (*)    All other components within normal limits  URINE CULTURE    Imaging Review No results found.      MDM   1. Urinary tract infection without hematuria, site unspecified       Jacqualine Mau, NP 12/08/16 2142

## 2016-12-11 LAB — URINE CULTURE: Culture: >=100000 — AB

## 2018-04-26 IMAGING — CT CT ABD-PELV W/ CM
2 of 5 series · 16 of 46 positions shown, 18 images · IV contrast (Omni 300)
Comparison: 06/01/2016

CLINICAL DATA: Shortness of breath after surgery. Lower abdominal
pain at the anterior surgical site.

EXAM:
CT ABDOMEN AND PELVIS WITH CONTRAST
TECHNIQUE: Multidetector CT imaging of the abdomen and pelvis was performed
using the standard protocol following bolus administration of
intravenous contrast.
CONTRAST:  100mL BDNQCJ-K99 IOPAMIDOL (BDNQCJ-K99) INJECTION 61%

[Series 2: a/p w/ 5mm · axial · 0.70mm/px · z∈[+896,+1310]mm · 13 of 93 slices shown, 15 images]
[im 5/93  soft-tissue]
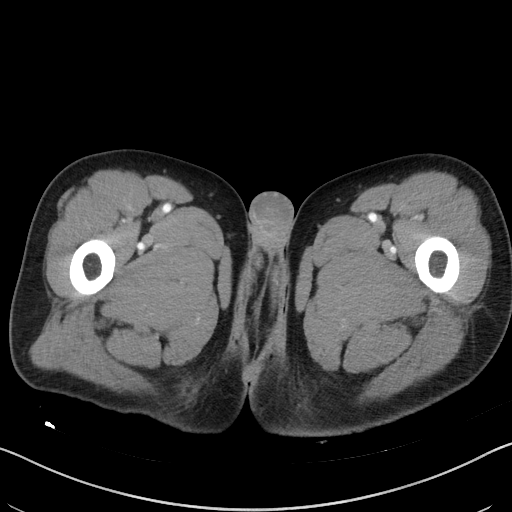
[im 5/93  bone]
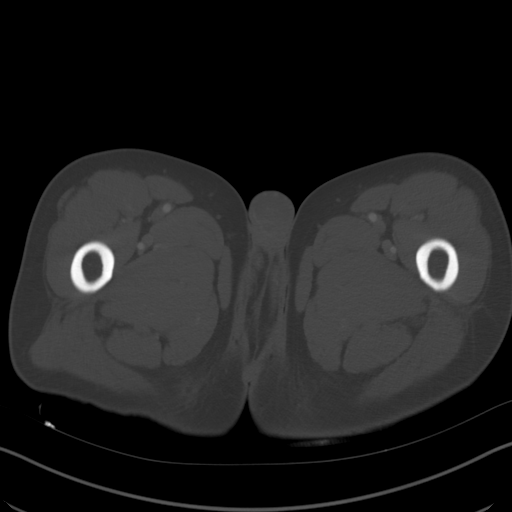
[im 15/93  soft-tissue]
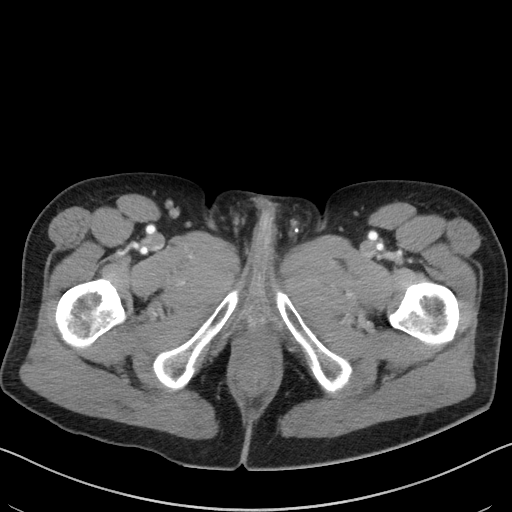
[im 20/93  soft-tissue]
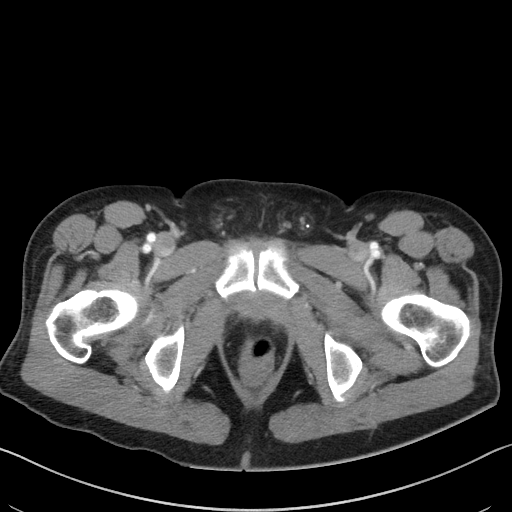
[im 25/93  soft-tissue]
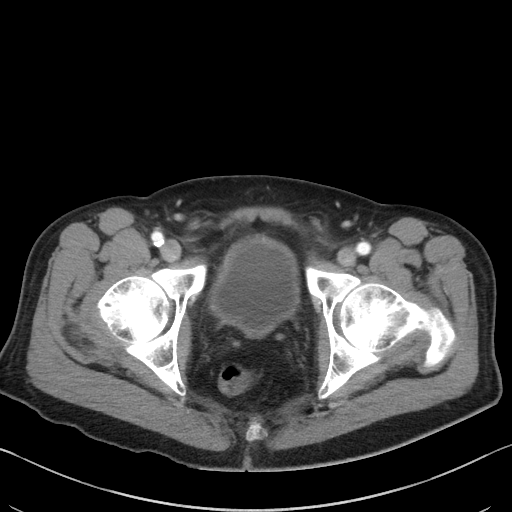
[im 34/93  soft-tissue]
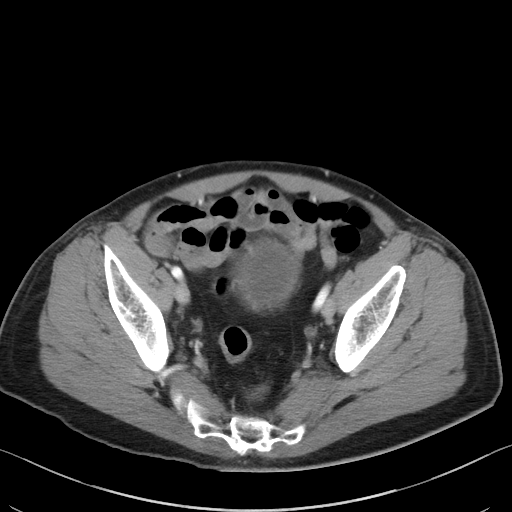
[im 39/93  soft-tissue]
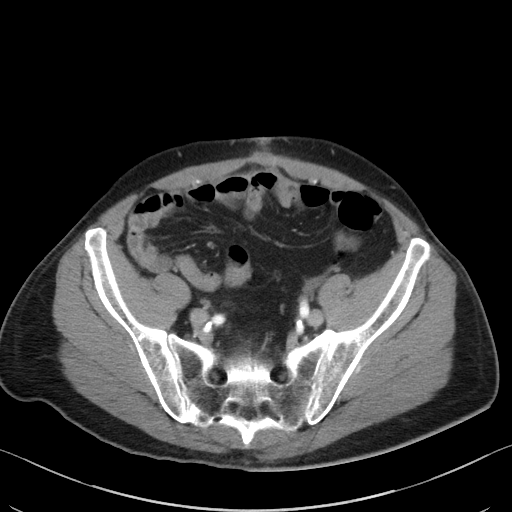
[im 49/93  soft-tissue]
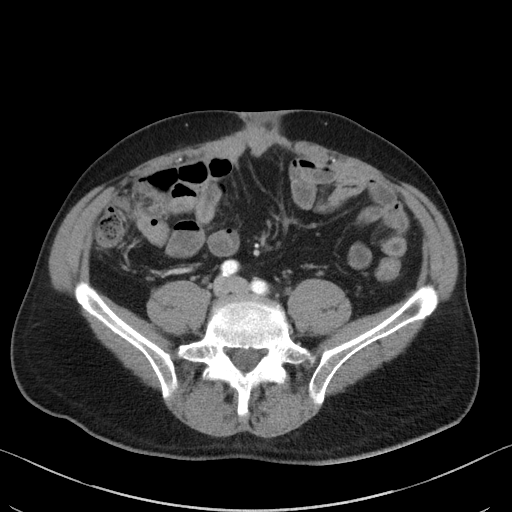
[im 54/93  soft-tissue]
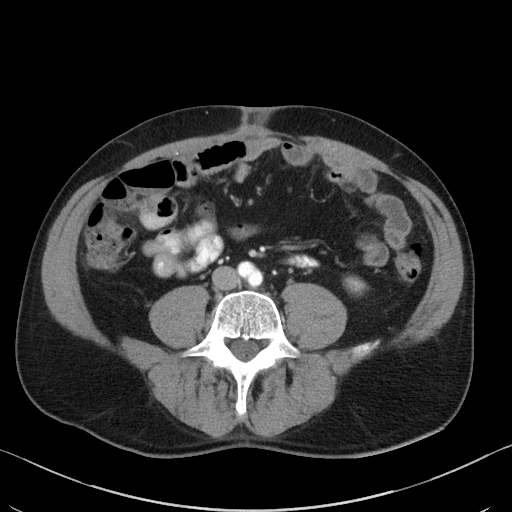
[im 59/93  soft-tissue]
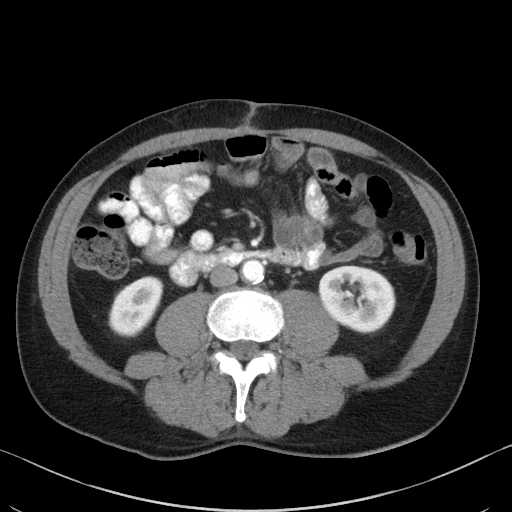
[im 59/93  bone]
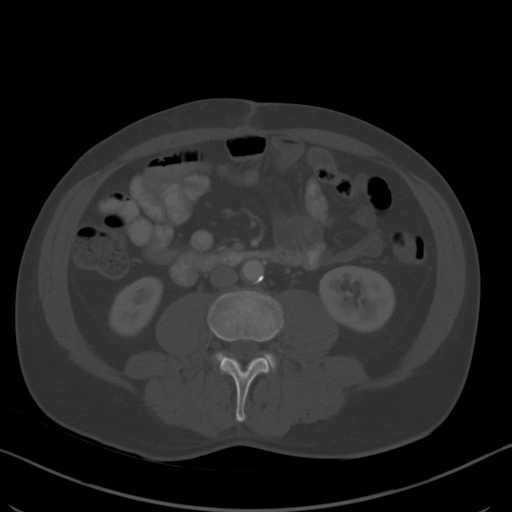
[im 68/93  soft-tissue]
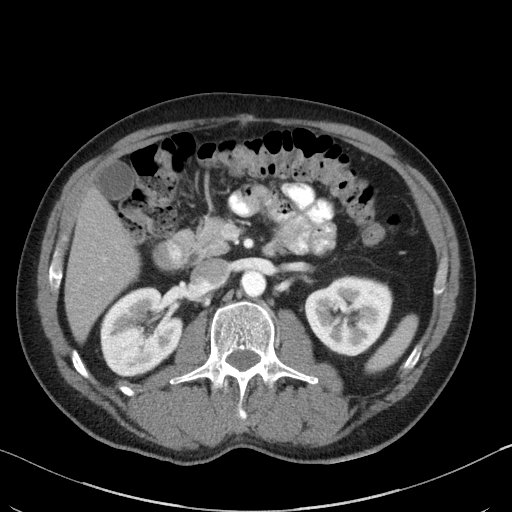
[im 73/93  soft-tissue]
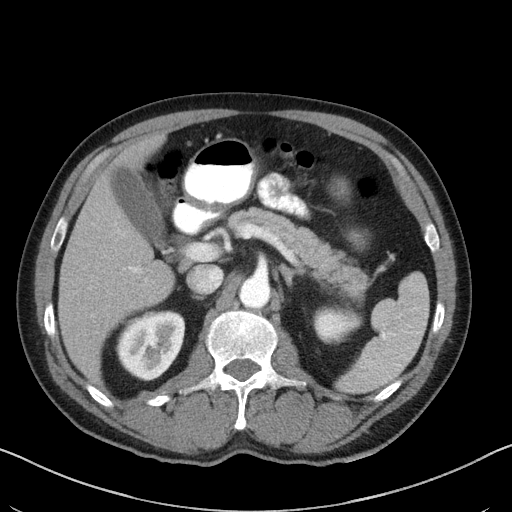
[im 78/93  soft-tissue]
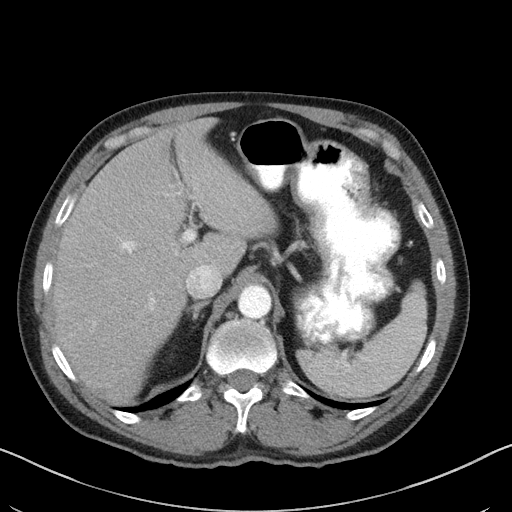
[im 88/93  soft-tissue]
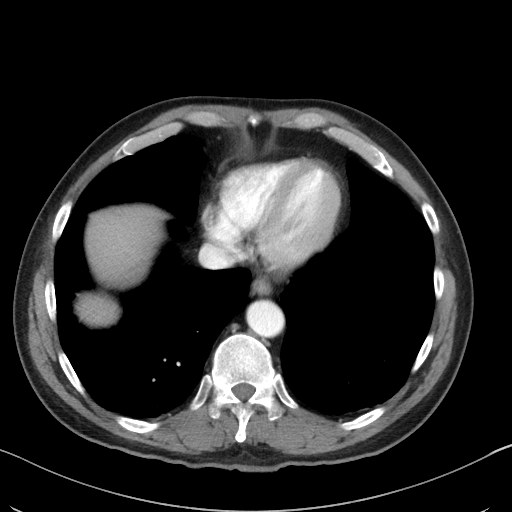

[Series 5: a/p w/ cor · coronal · 0.71mm/px · 3 of 132 slices shown]
[im 44/132  soft-tissue]
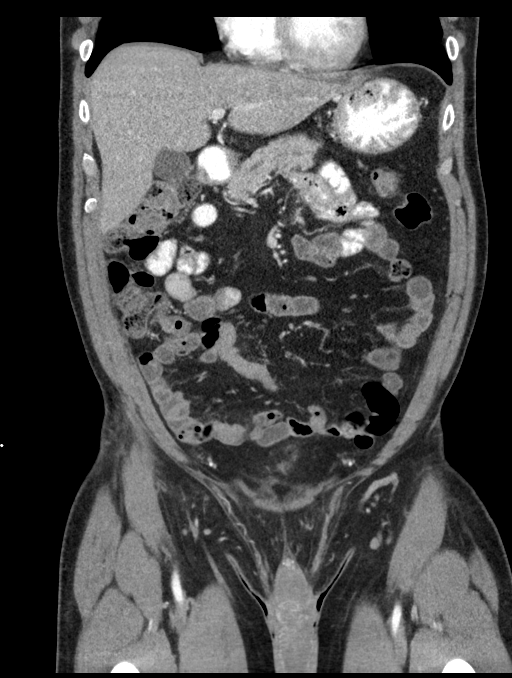
[im 59/132  soft-tissue]
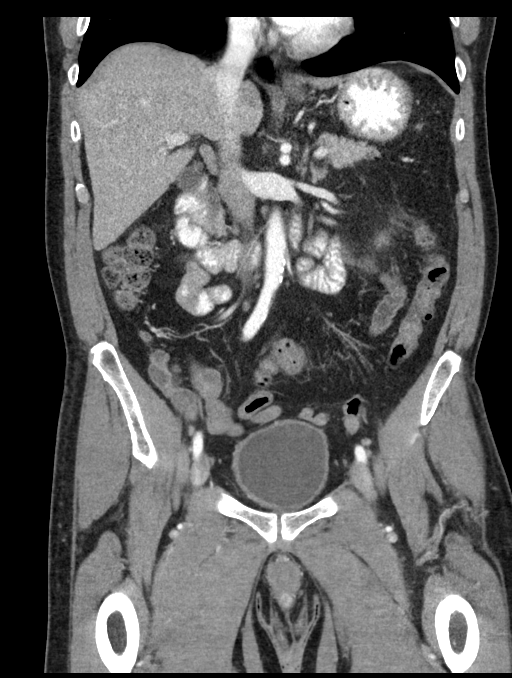
[im 73/132  soft-tissue]
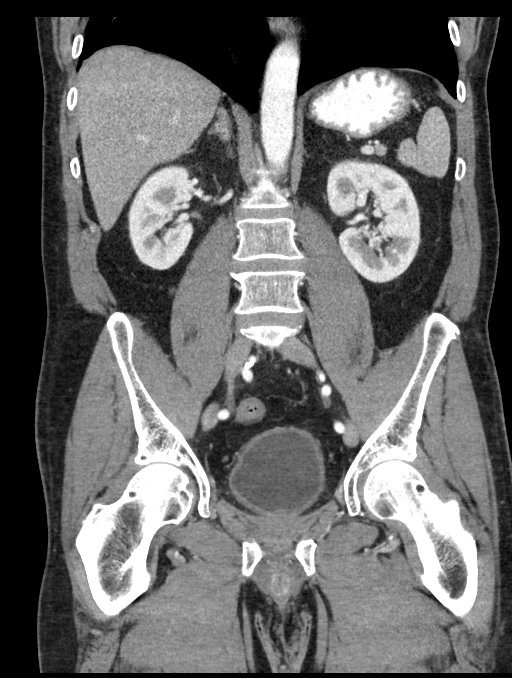

[16 of 46 positions shown; findings below may reference images not displayed]

FINDINGS: Atelectasis in the lung bases.

The liver, spleen, gallbladder, pancreas, adrenal glands, kidneys,
abdominal aorta, inferior vena cava, and retroperitoneal lymph nodes
are unremarkable. Mild aortic calcification consistent with early
aortic atherosclerosis. Stomach, small bowel, and colon are
decompressed. No residual small bowel distention. Stool fills the
colon. No bowel wall thickening is appreciated. Postoperative
changes in the anterior abdominal wall with open incision and mild
scarring in the wall. Small anterior abdominal wall hernia at the
incision site containing small bowel and fat. No proximal
obstruction. No free air or free fluid in the abdomen.

Pelvis: Appendix is normal. Bladder wall is mildly thickened,
possibly indicating cystitis. Prostate gland is surgically absent.
No free or loculated pelvic fluid collections. No pelvic mass or
lymphadenopathy.
IMPRESSION: Postoperative changes in the anterior abdominal wall with small
hernia at the incision site. Hernia contains fat and small bowel but
without evidence of small bowel obstruction. Bladder wall is
thickened, possibly indicating cystitis.

## 2018-05-31 IMAGING — CT CT ABD-PELV W/ CM
2 of 5 series · 15 of 46 positions shown, 17 images · IV contrast (Omni 300)
Comparison: Most recent CT 07/22/2016

CLINICAL DATA: Abdominal pain and distension. Bowel obstruction
requiring surgery last month.

EXAM:
CT ABDOMEN AND PELVIS WITH CONTRAST
TECHNIQUE: Multidetector CT imaging of the abdomen and pelvis was performed
using the standard protocol following bolus administration of
intravenous contrast.
CONTRAST:  100 cc [id number] IV

[Series 2: a/p w/ 5mm · axial · 0.77mm/px · z∈[-495,-70]mm · 12 of 97 slices shown, 14 images]
[im 6/97  soft-tissue]
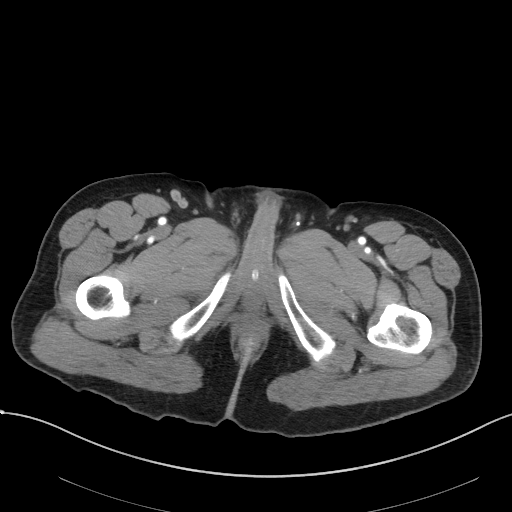
[im 6/97  bone]
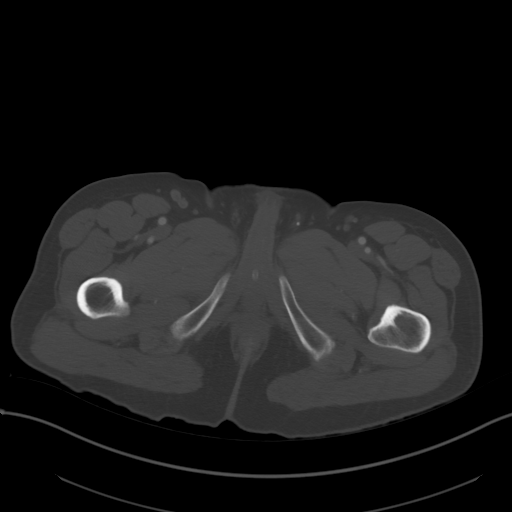
[im 16/97  soft-tissue]
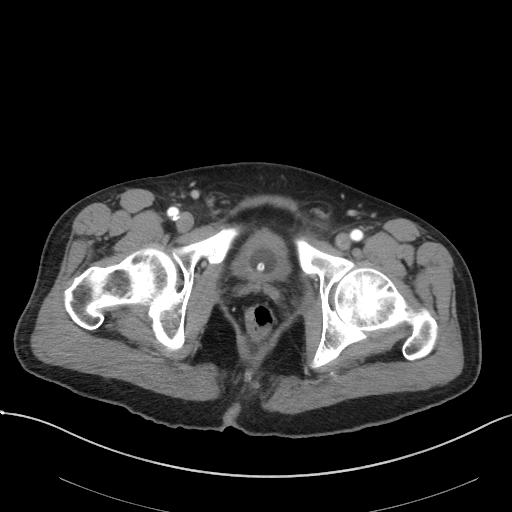
[im 21/97  soft-tissue]
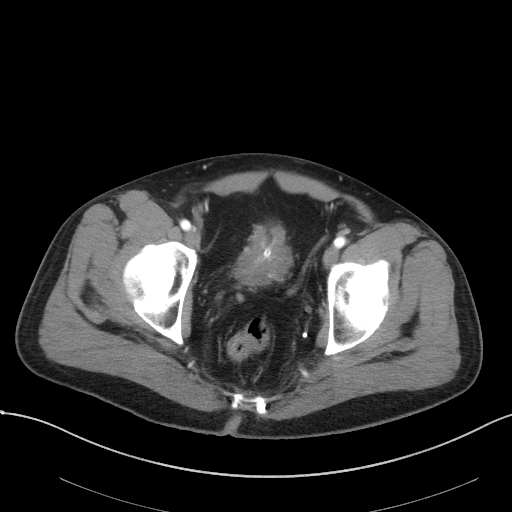
[im 31/97  soft-tissue]
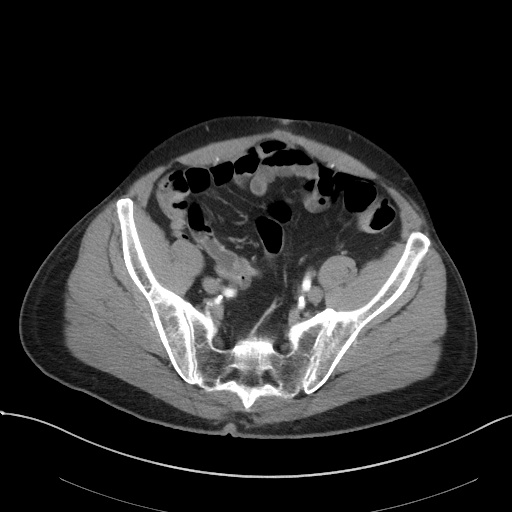
[im 36/97  soft-tissue]
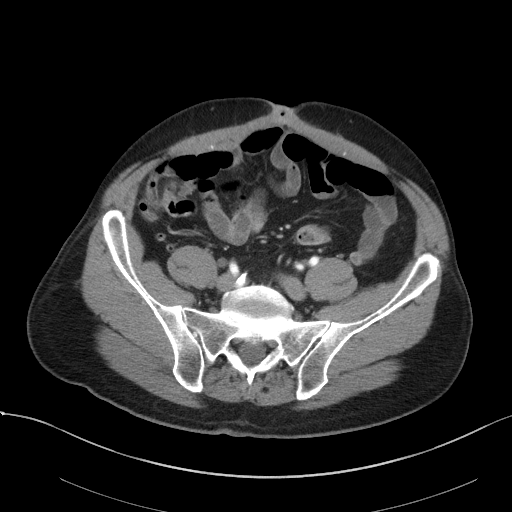
[im 46/97  soft-tissue]
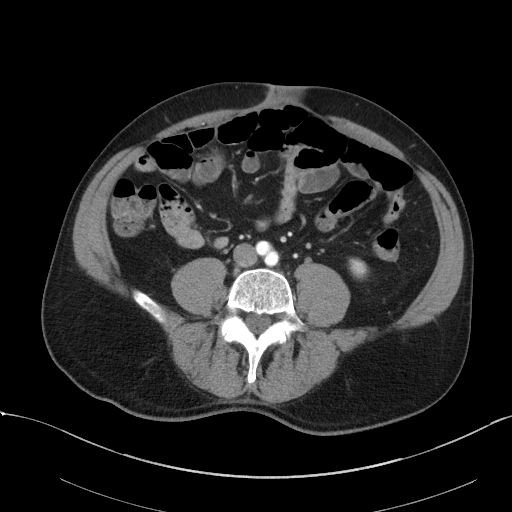
[im 51/97  soft-tissue]
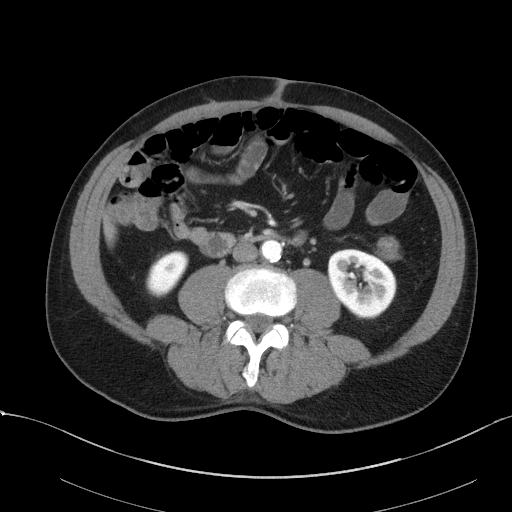
[im 61/97  soft-tissue]
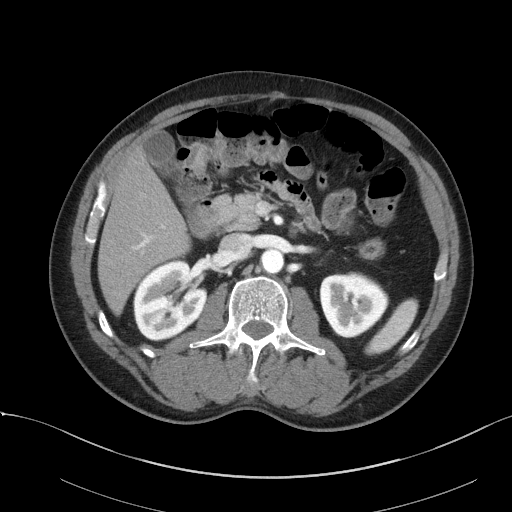
[im 66/97  soft-tissue]
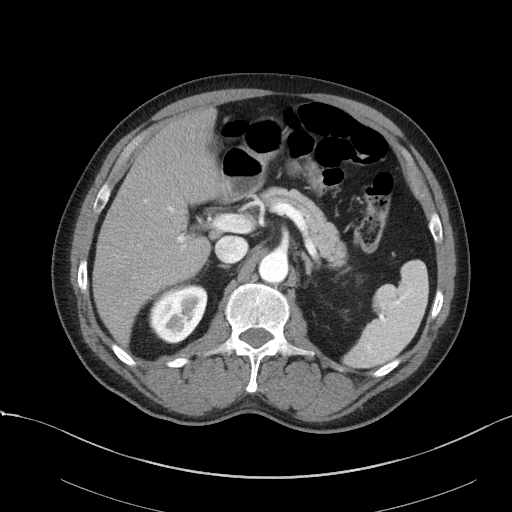
[im 66/97  bone]
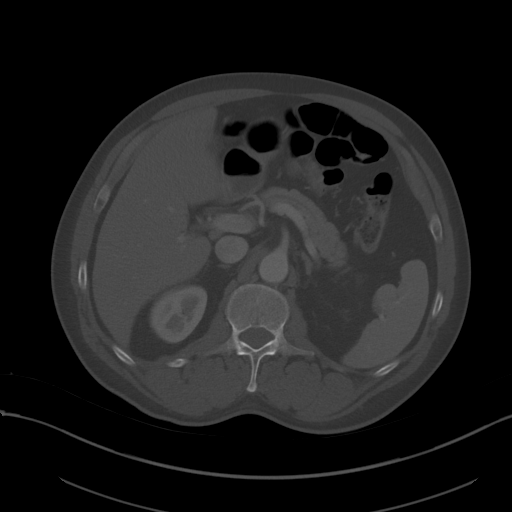
[im 76/97  soft-tissue]
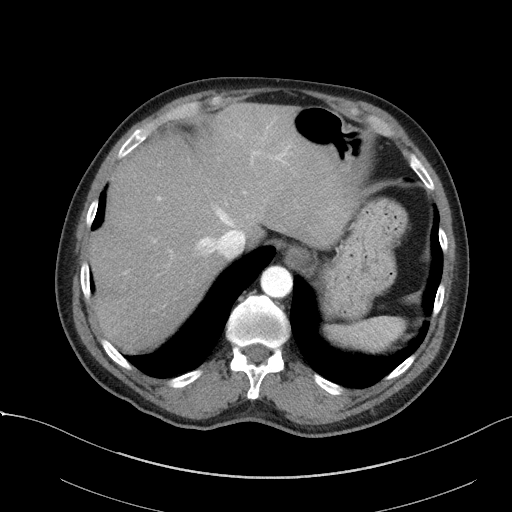
[im 81/97  soft-tissue]
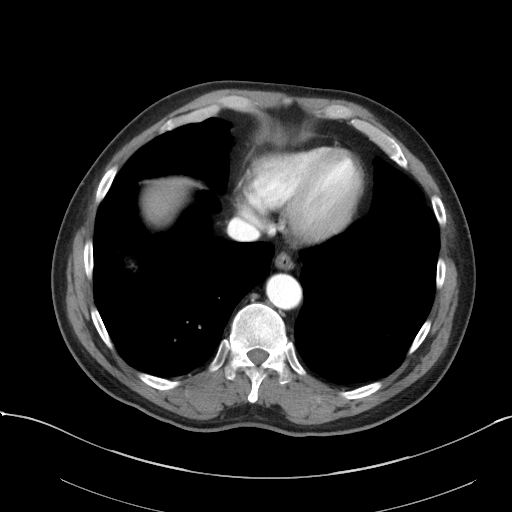
[im 91/97  soft-tissue]
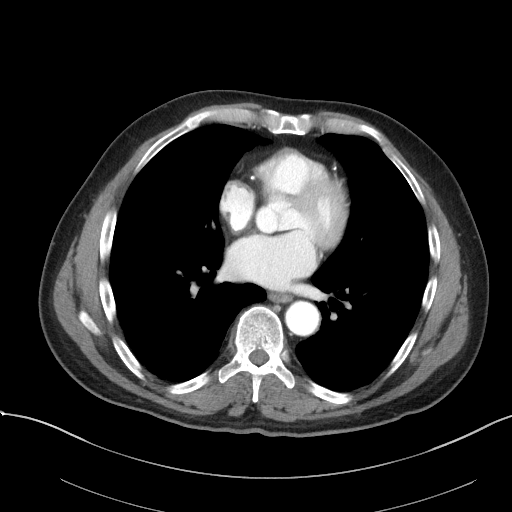

[Series 5: a/p w/ cor · coronal · 0.73mm/px · 3 of 130 slices shown]
[im 44/130  soft-tissue]
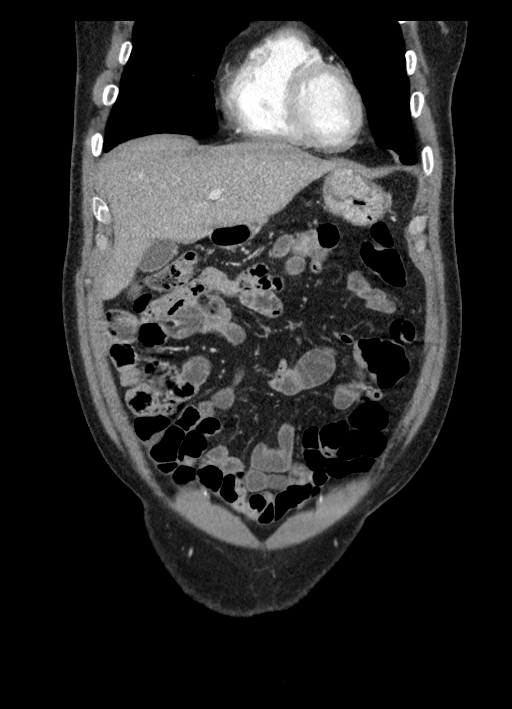
[im 58/130  soft-tissue]
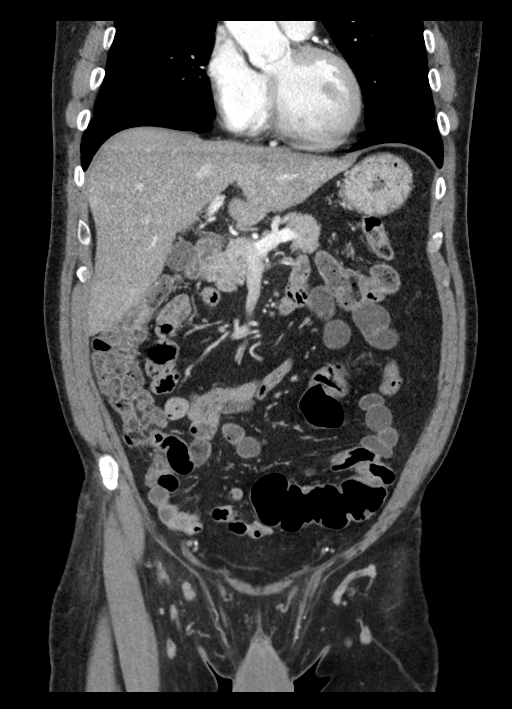
[im 72/130  soft-tissue]
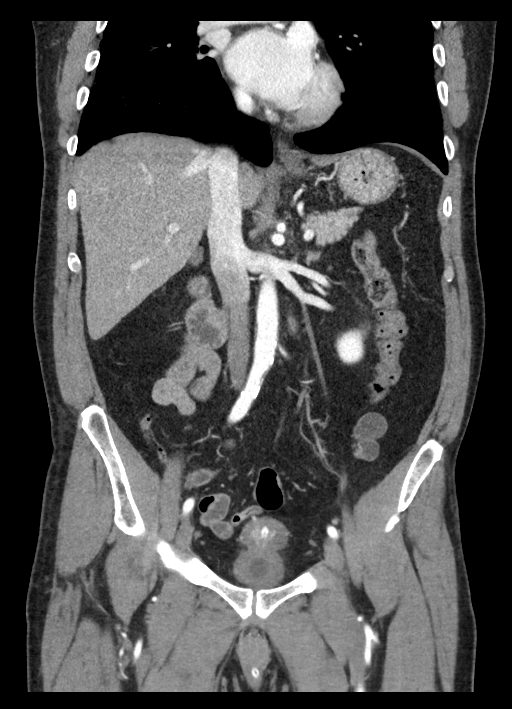

[15 of 46 positions shown; findings below may reference images not displayed]

FINDINGS: Lower chest: No pleural effusion or focal airspace disease.
Scattered scarring or atelectasis. Coronary artery calcifications.

Hepatobiliary: No focal liver abnormality is seen. No gallstones,
gallbladder wall thickening, or biliary dilatation.

Pancreas: No ductal dilatation or inflammation.

Spleen: Normal in size without focal abnormality.

Adrenals/Urinary Tract: Normal adrenal glands. Mild prominence of
the left ureter without frank hydronephrosis. No perinephric edema.
Tiny hypodensity in the lower right kidney. Symmetric renal
enhancement and excretion on delayed phase imaging. Urinary bladder
is decompressed by Foley catheter. There is diffuse bladder wall
thickening and enhancement.

Stomach/Bowel: Stomach is decompressed. Air throughout normal
caliber small and large bowel without evidence of obstruction. No
small bowel inflammation. Umbilical hernia contains nonobstructing
noninflamed small bowel, similar to prior. Small volume of stool
throughout the colon. No colonic inflammation. Normal appendix.

Vascular/Lymphatic: Aortic atherosclerosis. No enlarged abdominal or
pelvic lymph nodes.

Reproductive: Prostate gland not seen.

Other: No free intra-abdominal air. No free fluid. No
intra-abdominal abscess. Umbilical hernia contains nonobstructed
noninflamed small bowel. Postsurgical change of the anterior
abdominal wall. No subcutaneous fluid collection.

Musculoskeletal: There are no acute or suspicious osseous
abnormalities.
IMPRESSION: 1. No bowel obstruction. Umbilical hernia contains noninflamed
nonobstructed small bowel.
2. Bladder decompressed by Foley catheter, however bladder wall
thickening and enhancement are suspicious for or cystitis.

## 2020-09-03 ENCOUNTER — Other Ambulatory Visit: Payer: Self-pay

## 2020-09-03 ENCOUNTER — Encounter (HOSPITAL_COMMUNITY): Payer: Self-pay | Admitting: Emergency Medicine

## 2020-09-03 ENCOUNTER — Emergency Department (HOSPITAL_COMMUNITY)
Admission: EM | Admit: 2020-09-03 | Discharge: 2020-09-03 | Disposition: A | Discharge status: Home / Self Care | Payer: No Typology Code available for payment source | Attending: Emergency Medicine | Admitting: Emergency Medicine

## 2020-09-03 DIAGNOSIS — Z87891 Personal history of nicotine dependence: Secondary | ICD-10-CM | POA: Diagnosis not present

## 2020-09-03 DIAGNOSIS — R1030 Lower abdominal pain, unspecified: Secondary | ICD-10-CM | POA: Insufficient documentation

## 2020-09-03 DIAGNOSIS — Z79899 Other long term (current) drug therapy: Secondary | ICD-10-CM | POA: Insufficient documentation

## 2020-09-03 DIAGNOSIS — R339 Retention of urine, unspecified: Secondary | ICD-10-CM | POA: Insufficient documentation

## 2020-09-03 DIAGNOSIS — Z8546 Personal history of malignant neoplasm of prostate: Secondary | ICD-10-CM | POA: Diagnosis not present

## 2020-09-03 DIAGNOSIS — R000 Tachycardia, unspecified: Secondary | ICD-10-CM | POA: Insufficient documentation

## 2020-09-03 LAB — COMPREHENSIVE METABOLIC PANEL
ALT: 23 U/L (ref 0–44)
AST: 40 U/L (ref 15–41)
Albumin: 5.5 g/dL — ABNORMAL HIGH (ref 3.5–5.0)
Alkaline Phosphatase: 97 U/L (ref 38–126)
Anion gap: 16 — ABNORMAL HIGH (ref 5–15)
BUN: 13 mg/dL (ref 8–23)
CO2: 20 mmol/L — ABNORMAL LOW (ref 22–32)
Calcium: 10.2 mg/dL (ref 8.9–10.3)
Chloride: 102 mmol/L (ref 98–111)
Creatinine, Ser: 1.13 mg/dL (ref 0.61–1.24)
GFR calc Af Amer: >60 mL/min (ref >60)
GFR calc non Af Amer: >60 mL/min (ref >60)
Glucose, Bld: 103 mg/dL — ABNORMAL HIGH (ref 70–99)
Potassium: 3.8 mmol/L (ref 3.5–5.1)
Sodium: 138 mmol/L (ref 135–145)
Total Bilirubin: 1.6 mg/dL — ABNORMAL HIGH (ref 0.3–1.2)
Total Protein: 9.8 g/dL — ABNORMAL HIGH (ref 6.5–8.1)

## 2020-09-03 LAB — CBC WITH DIFFERENTIAL/PLATELET
Abs Immature Granulocytes: 0.02 10*3/uL (ref 0.00–0.07)
Basophils Absolute: 0 10*3/uL (ref 0.0–0.1)
Basophils Relative: 0 %
Eosinophils Absolute: 0 10*3/uL (ref 0.0–0.5)
Eosinophils Relative: 0 %
HCT: 47.2 % (ref 39.0–52.0)
Hemoglobin: 17.2 g/dL — ABNORMAL HIGH (ref 13.0–17.0)
Immature Granulocytes: 0 %
Lymphocytes Relative: 11 %
Lymphs Abs: 0.7 10*3/uL (ref 0.7–4.0)
MCH: 33.3 pg (ref 26.0–34.0)
MCHC: 36.4 g/dL — ABNORMAL HIGH (ref 30.0–36.0)
MCV: 91.3 fL (ref 80.0–100.0)
Monocytes Absolute: 0.4 10*3/uL (ref 0.1–1.0)
Monocytes Relative: 6 %
Neutro Abs: 5.4 10*3/uL (ref 1.7–7.7)
Neutrophils Relative %: 83 %
Platelets: 228 10*3/uL (ref 150–400)
RBC: 5.17 MIL/uL (ref 4.22–5.81)
RDW: 11.6 % (ref 11.5–15.5)
WBC: 6.5 10*3/uL (ref 4.0–10.5)
nRBC: 0 % (ref 0.0–0.2)

## 2020-09-03 LAB — URINALYSIS, MICROSCOPIC (REFLEX): Bacteria, UA: NONE SEEN

## 2020-09-03 LAB — URINALYSIS, ROUTINE W REFLEX MICROSCOPIC
Bilirubin Urine: NEGATIVE
Glucose, UA: NEGATIVE mg/dL
Ketones, ur: NEGATIVE mg/dL
Leukocytes,Ua: NEGATIVE
Nitrite: NEGATIVE
Protein, ur: NEGATIVE mg/dL
Specific Gravity, Urine: >1.03 — ABNORMAL HIGH (ref 1.005–1.030)
pH: 5.5 (ref 5.0–8.0)

## 2020-09-03 LAB — LIPASE, BLOOD: Lipase: 23 U/L (ref 11–51)

## 2020-09-03 MED ORDER — ONDANSETRON 4 MG PO TBDP
4.0000 mg | ORAL_TABLET | Freq: Once | ORAL | Status: AC
  Administered 2020-09-03: 4 mg via ORAL
  Filled 2020-09-03: qty 1

## 2020-09-03 MED ORDER — CEPHALEXIN 500 MG PO CAPS: 500.0000 mg | ORAL_CAPSULE | Freq: Two times a day (BID) | ORAL | 0 refills | Status: AC

## 2020-09-03 MED ORDER — SODIUM CHLORIDE 0.9 % IV SOLN
1.0000 g | Freq: Once | INTRAVENOUS | Status: AC
  Administered 2020-09-03: 1 g via INTRAVENOUS
  Filled 2020-09-03: qty 10

## 2020-09-03 MED ORDER — HYDROMORPHONE HCL 1 MG/ML IJ SOLN
1.0000 mg | Freq: Once | INTRAMUSCULAR | Status: AC
  Administered 2020-09-03: 1 mg via INTRAVENOUS
  Filled 2020-09-03: qty 1

## 2020-09-03 MED ORDER — OXYCODONE-ACETAMINOPHEN 5-325 MG PO TABS
1.0000 | ORAL_TABLET | Freq: Once | ORAL | Status: AC
  Administered 2020-09-03: 1 via ORAL
  Filled 2020-09-03: qty 1

## 2020-09-03 NOTE — ED Provider Notes (Signed)
Care assumed from [name] PA-C PA-C at shift change pending catheter placement.  See his note for full H&P.   Briefly this is 69 yo male with history of prostate cancer  S/p prostatectomy, recurrent bladder neck contracture and urinary retention presenting with acute onset of urinary retention.  No urine since 4 PM today.  Labs normal.  No UTI.  Previous prior discussed case with on-call urologist Dr. Gloriann Loan nursing staff were unable to place Foley catheter here in the department.  Dr. Gloriann Loan came to the bedside and was able to insert Foley catheter.  He asked for patient to be discharged home with prescription for Keflex x1 week.  His urine did not show UTI today however with the instrumentation used today want to cover for possible infection. Patient to follow up outpatient with his urologist.  The patient appears reasonably screened and/or stabilized for discharge and I doubt any other medical condition or other [facility] requiring further screening, evaluation, or treatment in the ED at this time prior to discharge. The patient is safe for discharge with strict return precautions discussed.    Portions of this note were generated with [employer]. Dictation errors may occur despite best attempts at proofreading.    Roger Savage 09/03/20 2219    Roger Ferguson, MD 09/04/20 [phone]

## 2020-09-03 NOTE — ED Triage Notes (Signed)
Patient c/o urinary retention. Last output at 0400. Hx of same. Patient diaphoretic.

## 2020-09-03 NOTE — Consult Note (Signed)
H&P Physician requesting consult: Milton Ferguson  Chief Complaint: Urinary retention  History of Present Illness: 69 year old male with a history of prostate cancer status post prostatectomy in the past.  He has a history of bladder neck contracture.  This required dilation a couple weeks ago.  He has since had the catheter removed and he presented back with retention here.  He states he has been cancer free.  He gets his urology care from [facility].  Past Medical History:  Diagnosis Date  . Anxiety   . Depression   . Prostate cancer ([place])   . Seizures ([place])   . Sleep deprivation    Past Surgical History:  Procedure Laterality Date  . LAPAROTOMY N/A 06/04/2016   Procedure: EXPLORATORY LAPAROTOMY;  Surgeon: Autumn Messing III, MD;  Location: [place];  Service: General;  Laterality: N/A;  . PROSTATE SURGERY      Home Medications:  (Not in a hospital admission)  Allergies: No Known Allergies  No family history on file. Social History:  reports that he has quit smoking. He has never used smokeless tobacco. He reports current alcohol use of about 6.0 standard drinks of alcohol per week. He reports that he does not use drugs.  ROS: A complete review of systems was performed.  All systems are negative except for pertinent findings as noted. ROS   Physical Exam:  Vital signs in last 24 hours: Temp:  [97.5 F (36.4 C)] 97.5 F (36.4 C) (09/25 1855) Pulse Rate:  [65-126] 65 (09/25 2135) Resp:  [16-20] 17 (09/25 2135) BP: (136-189)/(87-114) 145/91 (09/25 2135) SpO2:  [96 %-99 %] 98 % (09/25 2135) General:  Alert and oriented, No acute distress HEENT: Normocephalic, atraumatic Neck: No JVD or lymphadenopathy Cardiovascular: Regular rate and rhythm Lungs: Regular rate and effort Abdomen: Soft, nontender, nondistended, no abdominal masses Back: No CVA tenderness Extremities: No edema Genitourinary: Circumcised phallus.  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  Penile pump  palpated.  There is no sphincteric pump.  He denied having a sphincter. Neurologic: Grossly intact  Laboratory Data:  Results for orders placed or performed during the hospital encounter of 09/03/20 (from the past 24 hour(s))  Comprehensive metabolic panel     Status: Abnormal   Collection Time: 09/03/20  8:05 PM  Result Value Ref Range   Sodium 138 135 - 145 mmol/L   Potassium 3.8 3.5 - 5.1 mmol/L   Chloride 102 98 - 111 mmol/L   CO2 20 (L) 22 - 32 mmol/L   Glucose, Bld 103 (H) 70 - 99 mg/dL   BUN 13 8 - 23 mg/dL   Creatinine, Ser 1.13 0.61 - 1.24 mg/dL   Calcium 10.2 8.9 - 10.3 mg/dL   Total Protein 9.8 (H) 6.5 - 8.1 g/dL   Albumin 5.5 (H) 3.5 - 5.0 g/dL   AST 40 15 - 41 U/L   ALT 23 0 - 44 U/L   Alkaline Phosphatase 97 38 - 126 U/L   Total Bilirubin 1.6 (H) 0.3 - 1.2 mg/dL   GFR calc non Af Amer >60 >60 mL/min   GFR calc Af Amer >60 >60 mL/min   Anion gap 16 (H) 5 - 15  Lipase, blood     Status: None   Collection Time: 09/03/20  8:05 PM  Result Value Ref Range   Lipase 23 11 - 51 U/L  CBC with Differential     Status: Abnormal   Collection Time: 09/03/20  8:05 PM  Result Value Ref Range  WBC 6.5 4.0 - 10.5 K/uL   RBC 5.17 4.22 - 5.81 MIL/uL   Hemoglobin 17.2 (H) 13.0 - 17.0 g/dL   HCT 47.2 39 - 52 %   MCV 91.3 80.0 - 100.0 fL   MCH 33.3 26.0 - 34.0 pg   MCHC 36.4 (H) 30.0 - 36.0 g/dL   RDW 11.6 11.5 - 15.5 %   Platelets 228 150 - 400 K/uL   nRBC 0.0 0.0 - 0.2 %   Neutrophils Relative % 83 %   Neutro Abs 5.4 1.7 - 7.7 K/uL   Lymphocytes Relative 11 %   Lymphs Abs 0.7 0.7 - 4.0 K/uL   Monocytes Relative 6 %   Monocytes Absolute 0.4 0 - 1 K/uL   Eosinophils Relative 0 %   Eosinophils Absolute 0.0 0 - 0 K/uL   Basophils Relative 0 %   Basophils Absolute 0.0 0 - 0 K/uL   Immature Granulocytes 0 %   Abs Immature Granulocytes 0.02 0.00 - 0.07 K/uL  Urinalysis, Routine w reflex microscopic Urine, Catheterized     Status: Abnormal   Collection Time: 09/03/20  8:50 PM   Result Value Ref Range   Color, Urine YELLOW YELLOW   APPearance CLEAR CLEAR   Specific Gravity, Urine >1.030 (H) 1.005 - 1.030   pH 5.5 5.0 - 8.0   Glucose, UA NEGATIVE NEGATIVE mg/dL   Hgb urine dipstick MODERATE (A) NEGATIVE   Bilirubin Urine NEGATIVE NEGATIVE   Ketones, ur NEGATIVE NEGATIVE mg/dL   Protein, ur NEGATIVE NEGATIVE mg/dL   Nitrite NEGATIVE NEGATIVE   Leukocytes,Ua NEGATIVE NEGATIVE  Urinalysis, Microscopic (reflex)     Status: None   Collection Time: 09/03/20  8:50 PM  Result Value Ref Range   RBC / HPF 6-10 0 - 5 RBC/hpf   WBC, UA 0-5 0 - 5 WBC/hpf   Bacteria, UA NONE SEEN NONE SEEN   Squamous Epithelial / LPF 6-10 0 - 5   No results found for this or any previous visit (from the past 240 hour(s)). Creatinine: Recent Labs    09/03/20 2005  CREATININE 1.13    Under sterile conditions, I advanced a sensor wire into the urethra and into the bladder.  I then advanced a 24 French balloon dilator over the wire and across the strictured area.  I balloon dilated this to a pressure of 18.  I let this remain for approximately 1 minute and then deflated the balloon and removed it.  I placed a 67 [place] council tip catheter over the wire easily into the bladder.  There was return of clear urine.  I placed 10 cc of sterile water into the catheter balloon.  Patient tolerated procedure well.  Impression/Assessment:  Urinary retention Bladder neck contracture  Plan:  Continue Foley catheter for about 1 week.  He will follow-up with his primary urologist but I am of course happy to see him if he desires.  Marton Redwood, III 09/03/2020, 10:37 PM

## 2020-09-03 NOTE — ED Provider Notes (Signed)
[name] Note   CSN: [id number] Arrival date & time: 09/03/20  1848     History Chief Complaint  Patient presents with  . Urinary Retention    Roger Savage is a 69 y.o. male.  HPI   Patient with significant medical history of anxiety, prostate cancer status post prostatectomy, recurrent bladder neck contracture, urinary retention presents to the emergency department with chief complaint of urinary retention.  Patient states he has been unable to urinate since 4 AM this morning.  He complains of severe lower abdominal pain in his pelvic region.  He believes this pain is because he has been trying to strain and force urine out but has been I am detail and feels like his bladder is fully expanded.  Patient states he was seen as his urologist on 09/22 where he had his catheter removed and he was urinating without any difficulty.  He was supposed to follow-up with Dr.Mirzazadeh for cystoscope.  Patient states he is here because he needs a catheter to help relieve this pain.  Patient denies fever, chills, chest pain, shortness of breath, abdominal pain, nausea, vomiting, back pain, dysuria.   Past Medical History:  Diagnosis Date  . Anxiety   . Depression   . Prostate cancer ([place])   . Seizures ([place])   . Sleep deprivation     Patient Active Problem List   Diagnosis Date Noted  . Small bowel obstruction ([place]) 06/02/2016  . Alcohol abuse 01/12/2014  . Seizure ([place]) 01/10/2014    Past Surgical History:  Procedure Laterality Date  . LAPAROTOMY N/A 06/04/2016   Procedure: EXPLORATORY LAPAROTOMY;  Surgeon: Autumn Messing III, MD;  Location: [facility];  Service: General;  Laterality: N/A;  . PROSTATE SURGERY         No family history on file.  Social History   Tobacco Use  . Smoking status: Former Research scientist (life sciences)  . Smokeless tobacco: Never Used  Substance Use Topics  . Alcohol use: Yes    Alcohol/week: 6.0 standard drinks    Types: 6 Cans of  beer per week    Comment: PT trying to quit drinking - drinks a 6 pack/week  . Drug use: No    Home Medications Prior to Admission medications   Medication Sig Start Date End Date Taking? Authorizing Provider  acetaminophen (TYLENOL) 325 MG tablet Take 650 mg by mouth every 6 (six) hours as needed for mild pain or moderate pain.   Yes [provider]  albuterol (PROVENTIL HFA;VENTOLIN HFA) 108 (90 BASE) MCG/ACT inhaler Inhale 2 puffs into the lungs every 6 (six) hours as needed for wheezing or shortness of breath.    Yes [provider]  Cholecalciferol (VITAMIN D3) 2000 UNITS TABS Take 2,000 Units by mouth daily.   Yes [provider]  clonazePAM (KLONOPIN) 0.5 MG tablet Take 0.5 mg by mouth 3 (three) times daily as needed for anxiety.   Yes [provider]  Docusate Sodium (DSS) 100 MG CAPS Take 200 mg by mouth daily as needed for constipation.   Yes [provider]  mirtazapine (REMERON) 30 MG tablet Take 30 mg by mouth at bedtime.   Yes [provider]  omeprazole (PRILOSEC) 20 MG capsule Take 40 mg by mouth daily.   Yes [provider]  oxybutynin (DITROPAN) 5 MG tablet Take 5 mg by mouth 3 (three) times daily.    Yes [provider]  prazosin (MINIPRESS) 1 MG capsule Take 1 mg by mouth  at bedtime.   Yes [provider]  QUEtiapine (SEROQUEL) 100 MG tablet Take 50 mg by mouth at bedtime. 04/14/19  Yes [provider]  sertraline (ZOLOFT) 100 MG tablet Take 200 mg by mouth daily.   Yes [provider]  traZODone (DESYREL) 150 MG tablet Take 150 mg by mouth at bedtime as needed for sleep.    Yes [provider]  phenazopyridine (PYRIDIUM) 100 MG tablet Take 1 tablet (100 mg total) by mouth 3 (three) times daily as needed for pain. Patient not taking: Reported on 09/03/2020 12/08/16   Roger Mau, NP  traMADol (ULTRAM) 50 MG tablet Take 2 tablets (100 mg total) by mouth every 6  (six) hours as needed. Patient not taking: Reported on 09/03/2020 08/26/16   Roger Biles, MD    Allergies    Patient has no known allergies.  Review of Systems   Review of Systems  Constitutional: Negative for chills and fever.  HENT: Negative for congestion, tinnitus, trouble swallowing and voice change.   Eyes: Negative for visual disturbance.  Respiratory: Negative for cough and shortness of breath.   Cardiovascular: Negative for chest pain and palpitations.  Gastrointestinal: Positive for abdominal pain. Negative for diarrhea and vomiting.  Genitourinary: Positive for difficulty urinating. Negative for enuresis, flank pain, frequency, genital sores, penile swelling and scrotal swelling.  Musculoskeletal: Negative for back pain.  Skin: Negative for rash.  Neurological: Negative for dizziness and headaches.  Hematological: Does not bruise/bleed easily.    Physical Exam Updated Vital Signs BP (!) 145/91   Pulse 65   Temp (!) 97.5 F (36.4 C) (Oral)   Resp 17   SpO2 98%   Physical Exam Vitals and nursing note reviewed.  Constitutional:      General: He is in acute distress.     Appearance: He is not ill-appearing.  HENT:     Head: Normocephalic and atraumatic.     Nose: No congestion.     Mouth/Throat:     Mouth: Mucous membranes are moist.     Pharynx: Oropharynx is clear. No oropharyngeal exudate or posterior oropharyngeal erythema.  Eyes:     General: No scleral icterus. Cardiovascular:     Rate and Rhythm: Regular rhythm. Tachycardia present.     Pulses: Normal pulses.     Heart sounds: No murmur heard.  No friction rub. No gallop.   Pulmonary:     Effort: No respiratory distress.     Breath sounds: No wheezing, rhonchi or rales.  Abdominal:     General: There is no distension.     Palpations: Abdomen is soft.     Tenderness: There is abdominal tenderness. There is no right CVA tenderness, left CVA tenderness or guarding.     Comments: Patient abdomen was  nondistended, dull to percussion, normoactive bowel sounds.  It was tender to palpation his lower abdomen around his pelvic region.  No acute abdomen noted on exam.  Musculoskeletal:        General: No swelling or tenderness.     Right lower leg: No edema.     Left lower leg: No edema.  Skin:    General: Skin is warm and dry.     Capillary Refill: Capillary refill takes less than 2 seconds.     Findings: No rash.  Neurological:     Mental Status: He is alert.  Psychiatric:        Mood and Affect: Mood normal.     ED Results /  Procedures / Treatments   Labs (all labs ordered are listed, but only abnormal results are displayed) Labs Reviewed  COMPREHENSIVE METABOLIC PANEL - Abnormal; Notable for the following components:      Result Value   CO2 20 (*)    Glucose, Bld 103 (*)    Total Protein 9.8 (*)    Albumin 5.5 (*)    Total Bilirubin 1.6 (*)    Anion gap 16 (*)    All other components within normal limits  CBC WITH DIFFERENTIAL/PLATELET - Abnormal; Notable for the following components:   Hemoglobin 17.2 (*)    MCHC 36.4 (*)    All other components within normal limits  URINALYSIS, ROUTINE W REFLEX MICROSCOPIC - Abnormal; Notable for the following components:   Specific Gravity, Urine >1.030 (*)    Hgb urine dipstick MODERATE (*)    All other components within normal limits  LIPASE, BLOOD  URINALYSIS, MICROSCOPIC (REFLEX)    EKG None  Radiology No results found.  Procedures Procedures (including critical care time)  Medications Ordered in ED Medications  ondansetron (ZOFRAN-ODT) disintegrating tablet 4 mg (4 mg Oral Given 09/03/20 2011)  oxyCODONE-acetaminophen (PERCOCET/ROXICET) 5-325 MG per tablet 1 tablet (1 tablet Oral Given 09/03/20 2011)  HYDROmorphone (DILAUDID) injection 1 mg (1 mg Intravenous Given 09/03/20 2155)    ED Course  I have reviewed the triage vital signs and the nursing notes.  Pertinent labs & imaging results that were available during my  care of the patient were reviewed by me and considered in my medical decision making (see chart for details).    MDM Rules/Calculators/A&P                           Patient presents with pelvic pain and urinary retention.  Patient was alert, appeared to be in acute distress, vitals signs significant for tachycardia.  Will order screening labs for further evaluation.   CBC does not show leukocytosis or signs of anemia, CMP shows metabolic acidosis, hypoalbuminemia, anion gap of 16.  UA negative for nitrates or leukocytes, lipase 23.   Bladder scan was performed 200 cc.  Due to chronic bladder neck contractures and complaints of urinary retention will try to place urinary catheter.  Nursing staff was unable to place catheter.  Spoke with Dr. Gloriann Loan of urology who has agreed to come down and try to place a catheter.  He requested catheter and provide patient with Dilaudid for pain control.  I have low suspicion for systemic infection as patient is nontoxic-appearing, vital signs reassuring, no leukocytosis or obvious source infection on exam.  I suspect initial tachycardia was secondary to pain especially since tachycardia resolved after providing pain medication.  Low suspicion for [place] or urinary tract infection as UA does not show nitrates or leukocytes, no CVA tenderness noted on exam.  Low suspicion for intra-abdominal abnormality requiring surgical intervention as patient denies abdominal pain, nausea, vomiting, tolerating p.o., no acute abdomen on exam.  Low suspicion for pancreatitis as lipase was 23.  I suspect patient's pelvic pain is secondary to urinary retention due to his chronic bladder neck contractures.     Due to shift change patient will be handed off to [facility]  She is provided HPI, comorbidities, and likely disposition.  Pending Dr. Purvis Sheffield placement of Foley catheter and resolution of symptoms patient can be discharged and follow-up with his urologist Dr. Phineas Real  for  further management.    Final Clinical Impression(s) /  ED Diagnoses Final diagnoses:  Urinary retention    Rx / DC Orders ED Discharge Orders    None       Marcello Fennel, PA-C 09/03/20 2210    Lajean Saver, MD 09/05/20 [id number]

## 2020-09-03 NOTE — Discharge Instructions (Addendum)
Seen here for urinary retention,  placed a urinary catheter please continue home medications.  -Prescription sent to your pharmacy for Keflex.  This is used to treat urinary tract infections.  Your urine did not show infection today however due to the procedure we want to cover you for possible infection.  I want you to follow-up with your urologist for further evaluation management.  Please contact them to schedule appointment to see him this week.  Come back to emergency department if you develop inability to urinate, increased pelvic pain, fevers, chills, burning sensation when you urinate, chest pain, shortness of breath, nausea, vomiting, diarrhea.

## 2020-09-03 NOTE — ED Notes (Signed)
Attempted to insert foley catheter and unsuccessful, attempted with a 22fr, 71fr and a 76fr coude, provider notified. No trauma noted or bleeding, small amount of urine obtained and sent for analysis.

## 2024-05-26 NOTE — Progress Notes (Signed)
 Date of Service: 05/26/2024 Patient DOB: 1951-02-26   Subjective:     Patient ID: Roger Savage is a 73 y.o. male. Patient comes in for Medicare Annual Wellness Visit Subsequent (fasting/), Erectile Dysfunction, Anxiety, Depression, GERD, and Hyperlipidemia The patient is a 73yo male who presents for Medicare Wellness, annual physical, and medical evaluation. The patient has a history of:   -History of prostate cancer: s/p robotic prostatectomy by Dr. Karie in 2015. Developed contractures of the bladder neck in the past treated with surgery. Sees Dr. Artemio    -Erectile dysfunction: Status post penile prosthetic, on Cialis prn    -Spastic bladder: Currently on trospium 20mg  twice daily, oxybutynin  5 mg 3 times a day, prazosin 1mg  at night, sees urology.        -GERD: Currently on omeprazole 40 mg daily and controlled.        -Anxiety/depression with schizoaffective disorder: Sees psychiatry, currently on clonazepam 1 mg 3 times a day, trazodone  150 mg at night, sertraline 200 mg daily, Seroquel 50 mg at night, Remeron 30 mg at night,  currently controlled.        -b/l Gl[facility]: [facility];        -Dyslipidemia: not on any medication.        -Elevated blood sugars:      -prior alcohol abuse: Had issues with withdrawal seizures in the past. That improved and now social.    -History of small bowel obstruction: 2017 patient underwent exploratory laparotomy with lysis of lesions secondary to SBO.        -anemia:        -leukopenia:       -Left inguinal hernia: ref to surgery and had it repaired Dec 2024.        -elevated alk phos:  -constipation issues: uses Miralax twice a day and will have 2-3 times a day; pain is in the mid lower abdominal area which will interfere with his bladder, no nausea/vomiting; no bloody/black stools, abd is getting more swollen. Going on for 1 month, will only be able to have flatus when trying to  have a BM; Pt had a neg abd x-ray in 07/24.  Pt has some dysuria and has had UTI in the past with his constipation issue.   -right hand pain: going on for awhile, pain is intermittent and still in the morning;      C-scope: Done in 2018 with recommendation to repeat in 10 years (2028). No blood/black  stools   PSA: History of prostate cancer, 01/02/24 (0.08), sees urology.    Dexa: no recent bone fractures;    Vaccinations: tetanus is due after 11/20/22. Has had RSV vaccine and of shingles vaccines. Pt states that he thinks he got the prevnar 20 vaccine;   Hep C screening: 05/22 neg   Lung cancer screening:  quit over 20 years ago  AAA screening: due  Labs:    01/25 screening PSA of 0.08, CBC had low white blood cell count of 4.31, hemoglobin of 14.5, A1c of 5.2, lipids had triglycerides of 138, HDL of 47, LDL of 103, CMP had elevated alkaline phosphatase of 130, normal TSH   06/24 CMP lab was normal.  Patient's CBC lab had mildly low white blood cell count (3.7 from 3.5), mildly low hemoglobin (13.5 from 13.8) and slightly low platelets (142 from 154) which are  all chronic and stable we will continue to observe.   05/22 hepatitis C and A1c for his  blood sugars were normal.  Patient's cholesterol had moderately elevated triglycerides, CMP had a mild increase in patient's creatinine which could be secondary to dehydration from fasting and recommend to stay hydrated.  Patient CBC had mildly low white blood cells and hemoglobin which is new.  Would recommend to repeat nonfasting labs (CBC, iron, vitamin b12, folic acid , and BMP) in 6 weeks for reevaluation of kidney function and CBC.     Misc:    Review of Systems Review of Systems  Constitutional:  Negative for chills, fatigue and fever.  HENT:  Negative for congestion, hearing loss, rhinorrhea and sore throat.   Eyes:  Negative for pain and visual disturbance.  Respiratory:  Negative for cough, shortness of breath and wheezing.    Cardiovascular:  Negative for chest pain, palpitations and leg swelling.  Gastrointestinal:  Positive for constipation. Negative for abdominal distention, abdominal pain, anal bleeding, blood in stool, diarrhea, nausea, rectal pain and vomiting.  Genitourinary:  Negative for decreased urine volume, dysuria and hematuria.  Musculoskeletal:  Positive for arthralgias. Negative for back pain, gait problem, joint swelling, myalgias, neck pain and neck stiffness.  Skin:  Negative for rash and wound.  Neurological:  Negative for dizziness, seizures, syncope, weakness and headaches.  Hematological:  Negative for adenopathy. Does not bruise/bleed easily.  Psychiatric/Behavioral:  Negative for agitation, behavioral problems, sleep disturbance and suicidal ideas. The patient is not nervous/anxious.      Medical History[1]  Surgical History[2]    Allergies[3]    Social History   Social History Narrative   ** Merged History Encounter **      Family History[4]    Objective:  BP (!) 158/100   Pulse 70   Temp 98.2 F (36.8 C) (Oral)   Resp 16   Ht 1.725 m (5' 7.91)   Wt 74.8 kg (165 lb)   SpO2 98%   BMI 25.15 kg/m   Physical Exam Physical Exam Constitutional:      General: He is not in acute distress.    Appearance: Normal appearance. He is not ill-appearing.  HENT:     Head: Normocephalic and atraumatic.     Right Ear: Tympanic membrane, ear canal and external ear normal. There is no impacted cerumen.     Left Ear: Tympanic membrane, ear canal and external ear normal. There is no impacted cerumen.     Nose: Nose normal. No congestion or rhinorrhea.     Mouth/Throat:     Mouth: Mucous membranes are moist.     Pharynx: No oropharyngeal exudate or posterior oropharyngeal erythema.   Eyes:     General: No scleral icterus.       Right eye: No discharge.        Left eye: No discharge.     Extraocular Movements: Extraocular movements intact.     Conjunctiva/sclera:  Conjunctivae normal.     Pupils: Pupils are equal, round, and reactive to light.   Neck:     Vascular: No carotid bruit.   Cardiovascular:     Rate and Rhythm: Normal rate and regular rhythm.     Heart sounds: No murmur heard. Pulmonary:     Effort: Pulmonary effort is normal. No respiratory distress.     Breath sounds: Normal breath sounds. No stridor. No wheezing, rhonchi or rales.  Chest:     Chest wall: No tenderness.  Abdominal:     General: There is no distension.     Palpations: Abdomen is soft. There is no mass.  Tenderness: There is no abdominal tenderness. There is no right CVA tenderness, left CVA tenderness, guarding or rebound.     Hernia: No hernia is present.     Comments: Mild discomfort in the mid lower abdominal range with slight to mild abdominal distention but no guarding or rigidity.   Musculoskeletal:        General: No swelling, tenderness, deformity or signs of injury.     Cervical back: Neck supple. No tenderness.     Right lower leg: No edema.     Left lower leg: No edema.  Lymphadenopathy:     Cervical: No cervical adenopathy.   Skin:    Coloration: Skin is not jaundiced or pale.     Findings: No bruising, erythema, lesion or rash.   Neurological:     General: No focal deficit present.     Mental Status: He is alert and oriented to person, place, and time.     Cranial Nerves: No cranial nerve deficit.     Motor: No weakness.     Gait: Gait normal.   Psychiatric:        Mood and Affect: Mood normal.        Behavior: Behavior normal.        Thought Content: Thought content normal.        Judgment: Judgment normal.         Labs: No results found for this or any previous visit (from the past week).  Assessment/Plan:  1. Encounter for Medicare annual wellness exam (Primary) Medicare questions were answered reviewed.  2. Physical exam Will call for lab results. 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 Next time you are at the [place], make sure you are up-to-date on all of your vaccines.  3. History of prostate cancer Follow-up with urology as directed.  4. Erectile dysfunction, unspecified erectile dysfunction type Follow-up with urology as directed.  5. Spastic bladder Controlled, continue medication follow with urology as directed.  6. GERD without esophagitis Controlled, continue omeprazole as directed.  7. Anxiety and depression Controlled, continue medication and follow-up with psychiatry as directed.  8. Schizoaffective disorder, depressive type (HCC) Controlled, continue medication and follow-up with psychiatry as directed.  9. H/O: glaucoma Follow with the eye doctor as directed.  10. Dyslipidemia Currently not on any cholesterol medication.  Obtain fasting labs now. Recommend a low fat/carbohydrate diet with increase in exercising.  - Comprehensive Metabolic Panel - Lipid Panel  11. Elevated blood sugar Obtain A1c lab now. Recommend a low fat/carbohydrate diet with increase in exercising.   - POC Hemoglobin A1c  12. Anemia, unspecified type Obtain CBC lab now.  13. Leukopenia, unspecified type Obtain CBC lab now.    15. Elevated alkaline phosphatase level Obtain CMP lab now.  16. Bowel habit changes Continue to take MiraLAX as needed and directed but will obtain a CT of the abdomen pelvis for further evaluation of ongoing constipation/abdominal pain.  Go to the emergency room right away for any severe abdominal pain, fever/chills, and/or for nausea/vomiting. - CT Abdomen Pelvis W Contrast; Future - TSH  17. History of tobacco use Due to prior smoking history, obtain screening abdominal ultrasound now. - US  Abdomen Aortic Aneurysm Screening; Future  18. Encounter for abdominal aortic aneurysm (AAA) screening Due to prior smoking history, obtain screening abdominal ultrasound now. - US  Abdomen Aortic Aneurysm Screening; Future  19.  Abdominal pain, unspecified abdominal location Office urinalysis had  some inflammatory cells and will be sent for culture. Continue to take MiraLAX as needed and directed but will obtain a CT of the abdomen pelvis for further evaluation of ongoing constipation/abdominal pain.  Go to the emergency room right away for any severe abdominal pain, fever/chills, and/or for nausea/vomiting. - POC Urinalysis Auto without Microscopic - CT Abdomen Pelvis W Contrast; Future   Obtain labs (CBC, CMP, lipids, A1c, TSH) now.  Follow-up in 1 year or sooner if needed.  Call for any questions/concerns.     Return in 1 year (on 05/27/2025) for Medicare Annual Wellness Visit .  Electronically signed by: Shawn P Lazoff, DO 05/26/2024 2:32 PM  This document was created using the aid of voice recognition Dragon dictation software.    [facility] Medicare Wellness Visit Type:: Subsequent Annual Wellness Visit  Name: Roger Savage Date of Birth: 1951-06-12 Age: 73 y.o. MRN: [id number] Visit Date: 05/26/2024  History obtained from: patient  Living Arrangements/Support System/Health Assessment/Pain/Stress Marital status: divorced Number of children: 3 Occupation: disable Living arrangements: (!) lives alone Does the patient have a support system (family, friend, church, Conservation officer, nature, etc)?: Yes   Do you have any dental concerns?: (!) Yes In the past month, have you experienced a change in your bladder control?: No   Do you have any difficulty obtaining your medications?: No   Do you have trouble consistently taking or remembering to take all of your medications as prescribed?: No Patient rates overall stress level as: Some Does stress affect daily life?: No Typical amount of pain: (!) a lot Does pain affect daily life?: (!) Yes Are you currently prescribed opioids?: No                Depression Screening  Behavioral Health  Screening  Patient Health Questionnaire-2 Score: 0 (05/26/2024  2:16 PM)      Patient's Depression screening/score = Negative    Depression Plan: Normal/Negative Screening    Social History (Tobacco/Drugs/Sexual Activity) Mohamed reports that he quit smoking about 29 years ago. His smoking use included cigarettes. He has been exposed to tobacco smoke. He has never used smokeless tobacco. Tobacco Use?: No How many times in the past year have you used a recreational drug or used a prescription medication for nonmedical reasons?: None Risk factors for sexually transmitted infections (i.e., multiple sexual partners): No    Alcohol Screening How often do you have a drink containing alcohol?: Never How many standard drinks containing alcohol do you have on a typical day?: Never, 1 or 2 drinks How often do you have six or more drinks on one occasion?: Never Audit-C Score: 0  Physical Activity Regular exercise?: Yes Exercise frequency (times per week): 7 Exercise intensity: moderate (like brisk walking)  Diet How many meals a day?: 4 Eats fruit and vegetables daily?: Yes Most meals are obtained by: eating out, preparing their own meals  Home and Transportation Safety All rugs have non-skid backing?: Yes All stairs or steps have railings?: N/A, no stairs Grab bars in the bathtub or shower?: Yes Have non-skid surface in bathtub or shower?: Yes Good home lighting?: Yes Regular seat belt use?: Yes      Activities of Daily Living Feed self?: Yes Bathe self?: Yes Dress self?: Yes Use toilet without assistance?: Yes Walk without assistance?: Yes    Instrumental Activities of Daily Living Manage finances?: Yes Shop for themselves?: Yes Prepare meals?: Yes Use the telephone?: Yes Manage medications?:  Yes   Performs basic housework/laundry?: Yes Drives?: Yes Primary transportation is: driving  Hearing Concerns about hearing?: No Uses hearing aids?: No Hear whispered  voice? (Observed): Yes  Vision Concerns about vision?: (!) Yes    Fall Risk Is the patient ambulatory?: Yes One or more falls in the last year:: No Feels unsteady when walking:: No  Cognitive Assessment Has a diagnosis of dementia or cognitive impairment?: No Are there any memory concerns by the patient, others, or providers?: No              Advance Directives Living will?: (!) No Advance directive information provided to patient: Yes Healthcare POA?: (!) No       Who is your in case of emergency contact?: adriainne Levier & tyesha eller Relationship to patient: Sibling Emergency contact's phone number: on file   Other History I reviewed and updated the following risk factors and conditions as appropriate: Reviewed/Updated: Problem List, Medical History, Surgical History, Family History, Medications, Allergies Reviewed/Updated: Vital Signs (height, weight, and BP), Immunizations, Health Maintenance Patient Care Team Updated: Done  Vital Signs BP (!) 158/100   Pulse 70   Temp 98.2 F (36.8 C) (Oral)   Resp 16   Ht 1.725 m (5' 7.91)   Wt 74.8 kg (165 lb)   SpO2 98%   BMI 25.15 kg/m   Screening and Immunizations Health Maintenance Status       Date Due Completion Dates   Colorectal Cancer Screening Never done ---   Abdominal Aortic Aneurysm (AAA) Screening Never done ---   DTaP/Tdap/Td Vaccines (3 - Td or Tdap) 06/12/2024 (Originally 11/20/2022) 11/20/2012, 12/31/2008   ZOSTER VACCINE (2 of 2) 06/12/2024 (Originally 02/27/2024) 01/02/2024   COVID-19 Vaccine (6 - 2024-25 season) 05/26/2025 (Originally 03/12/2024) 09/12/2023, 10/26/2022   Depression Monitoring 11/25/2024 05/26/2024   Comprehensive Annual Visit 05/26/2025 05/26/2024, 04/17/2021   Medicare Annual Wellness (AWV) Subsequent Visits 05/26/2025 05/26/2024, 05/26/2024   Pneumococcal Vaccine for Ages 50+ (3 of 3 - PCV20 or PCV21) 04/17/2026 04/17/2021, 10/23/2013       Immunization History  Administered  Date(s) Administered  . Influenza Vaccine, Quadrivalent, Adjuvanted 10/03/2021  . Influenza, High-dose Seasonal, Quadrivalent, Preservative Free 10/02/2022  . Influenza, Unspecified 10/27/2014  . Influenza, high-dose, trivalent, PF 09/12/2023  . Moderna Covid-19, mRNA,LNP-S,PF 12+ Yrs 10/26/2022, 09/12/2023  . Moderna SARS-CoV-2 Bivalent Booster 6+ yrs 10/03/2021  . Moderna SARS-CoV-2 Primary Series 12+ yrs 02/09/2020, 12/29/2020  . Pneumococcal Conjugate 13-Valent 04/17/2021  . Pneumococcal Polysaccharide Vaccine, 23 Valent (PNEUMOVAX-23) 2Y+ 10/23/2013  . RSV, PF (ABRYSVO) - Pregnancy 32 to 36 weeks ONLY 01/02/2024  . TD vaccine (ADULT)PF(TENIVAC) 7Y+ 12/31/2008, 11/20/2012  . TDAP VACCINE (BOOSTRIX,ADACEL) 7Y+ 12/31/2008  . Varicella Zoster [facility]) 18Y+ 01/02/2024    Assessment/Plan: Subsequent Annual Wellness Visit: The topics above were reviewed with the patient.  Healthy lifestyle principles reviewed.  Recommendations provided when indicated.  Follow up 1 year for next wellness visit.  No orders of the defined types were placed in this encounter.   No orders of the defined types were placed in this encounter.   Patient Care Team: Shawn P Lazoff, DO as PCP - General (Family Medicine) Bernardino JAYSON Level, FNP as PCP - Attributed  Electronically signed by: Shawn P Lazoff, DO 05/26/2024 2:32 PM       [1] Past Medical History: Diagnosis Date  . Anxiety   . Asthma (CMD)   . COPD (chronic obstructive pulmonary disease)    (CMD)   . GERD (gastroesophageal reflux disease)   .  Psychiatric problem   . PTSD (post-traumatic stress disorder)   . S/P prostatectomy   [2] Past Surgical History: Procedure Laterality Date  . BLADDER NECK RECONSTRUCTION N/A 04/10/2017   Procedure: BLADDER NECK INCISION;  Surgeon: Leeann Junius Sage, MD;  Location[facility] MAIN OR;  Service: Urology;  Laterality: N/A;  . CYSTOSCOPY N/A 04/10/2017   Procedure: CYSTOSCOPY;  Surgeon: Leeann Junius Sage, MD;   Location: [facility] MAIN OR;  Service: Urology;  Laterality: N/A;  . EXPLORATORY LAPAROTOMY W/ BOWEL RESECTION     Procedure: LAPAROTOMY EXPLORATORY W/ BOWEL RESECTION  . HERNIA REPAIR     Procedure: HERNIA REPAIR  . INTERNAL URETHROTOMY N/A 06/22/2019   Procedure: INTERNAL URETHROTOMY;  Surgeon: Rhae Loges, MD;  Location: [facility] MAIN OR;  Service: Urology;  Laterality: N/A;  . PENILE PROSTHESIS IMPLANT N/A 07/24/2018   Procedure: PENILE PROSTHESIS INSERTION;  Surgeon: Rhae Loges, MD;  Location: [facility] MAIN OR;  Service: Urology;  Laterality: N/A;  . PROSTATECTOMY  04/13   Procedure: PROSTATECTOMY  . TRANSURETHRAL RESECTION OF BLADDER N/A 04/04/2020   Procedure: TRANSURETHRAL RESECTION  WITH BLADDER NECK INCISION WITH  MITOMYCIN C INJECTION;  Surgeon: Rhae Loges, MD;  Location: [facility] MAIN OR;  Service: Urology;  Laterality: N/A;  . TRANSURETHRAL RESECTION OF BLADDER TUMOR N/A 03/26/2016   Procedure: TRANSURETHRAL RESECTION BLADDER NECK;  Surgeon: Rhae Loges, MD;  Location: [facility] MAIN OR;  Service: Urology;  Laterality: N/A;  . URETHRAL DILATION N/A 04/10/2017   Procedure: URETHRAL DILATION;  Surgeon: Leeann Junius Sage, MD;  Location: [facility] MAIN OR;  Service: Urology;  Laterality: N/A;  [3] No Known Allergies [4] Family History Problem Relation Name Age of Onset  . Hypertension Mother    . Hypertension Father    . Anesthesia problems Neg Hx

## 2024-05-28 NOTE — Progress Notes (Signed)
 Patient's thyroid, A1c, and CMP labs are normal.  Patient's cholesterol was moderately elevated (triglycerides elevated at 181, LDL elevated at 160) where his 10-year cardiovascular risk is elevated (13.7%) and would recommend for the patient to start cholesterol medication called Crestor (rosuvastatin) 10 mg daily with repeat fasting labs in 3 months for reevaluation however patient should call if he develops any new muscle or joint pains after starting Crestor as this could be a potential side effect.  Still recommend a low-fat/low carbohydrate diet with increased exercise.  Call for any questions/concerns. If patient is willing, please order Crestor 10 mg tablet take 1 tablet p.o. daily #90 tablets with 1 refill.  The 10-year ASCVD risk score (Arnett DK, et al., 2019) is: 13.7%   Values used to calculate the score:     Age: 26 years     Clincally relevant sex: Male     Is Non-Hispanic African American: Yes     Diabetic: No     Tobacco smoker: No     Systolic Blood Pressure: 140 mmHg     Is BP treated: No     HDL Cholesterol: 74 mg/dL     Total Cholesterol: 268 mg/dL

## 2024-05-29 NOTE — Progress Notes (Signed)
 Macrobid sent to pharmacy.

## 2024-06-01 NOTE — Progress Notes (Signed)
 Please have the pt repeat a urine sample 1 week after done with abx.

## 2024-06-05 NOTE — Progress Notes (Signed)
 Please let patient know that his screening abdominal aortic ultrasound was negative.

## 2024-06-11 ENCOUNTER — Emergency Department (HOSPITAL_COMMUNITY)
Admission: EM | Admit: 2024-06-11 | Discharge: 2024-06-12 | Disposition: A | Discharge status: Home / Self Care | Source: Ambulatory Visit | Attending: Emergency Medicine | Admitting: Emergency Medicine

## 2024-06-11 ENCOUNTER — Encounter (HOSPITAL_COMMUNITY): Payer: Self-pay

## 2024-06-11 ENCOUNTER — Other Ambulatory Visit: Payer: Self-pay

## 2024-06-11 DIAGNOSIS — Z8546 Personal history of malignant neoplasm of prostate: Secondary | ICD-10-CM | POA: Diagnosis not present

## 2024-06-11 DIAGNOSIS — R1032 Left lower quadrant pain: Secondary | ICD-10-CM | POA: Insufficient documentation

## 2024-06-11 DIAGNOSIS — D72819 Decreased white blood cell count, unspecified: Secondary | ICD-10-CM | POA: Insufficient documentation

## 2024-06-11 LAB — CBC
HCT: 39.4 % (ref 39.0–52.0)
Hemoglobin: 13.9 g/dL (ref 13.0–17.0)
MCH: 32.8 pg (ref 26.0–34.0)
MCHC: 35.3 g/dL (ref 30.0–36.0)
MCV: 92.9 fL (ref 80.0–100.0)
Platelets: 159 10*3/uL (ref 150–400)
RBC: 4.24 MIL/uL (ref 4.22–5.81)
RDW: 13 % (ref 11.5–15.5)
WBC: 3.6 10*3/uL — ABNORMAL LOW (ref 4.0–10.5)
nRBC: 0 % (ref 0.0–0.2)

## 2024-06-11 LAB — COMPREHENSIVE METABOLIC PANEL WITH GFR
ALT: 16 U/L (ref 0–44)
AST: 23 U/L (ref 15–41)
Albumin: 4.5 g/dL (ref 3.5–5.0)
Alkaline Phosphatase: 96 U/L (ref 38–126)
Anion gap: 10 (ref 5–15)
BUN: 14 mg/dL (ref 8–23)
CO2: 23 mmol/L (ref 22–32)
Calcium: 9.5 mg/dL (ref 8.9–10.3)
Chloride: 107 mmol/L (ref 98–111)
Creatinine, Ser: 1.11 mg/dL (ref 0.61–1.24)
GFR, Estimated: >60 mL/min (ref >60)
Glucose, Bld: 102 mg/dL — ABNORMAL HIGH (ref 70–99)
Potassium: 4.3 mmol/L (ref 3.5–5.1)
Sodium: 140 mmol/L (ref 135–145)
Total Bilirubin: 0.7 mg/dL (ref 0.0–1.2)
Total Protein: 7.6 g/dL (ref 6.5–8.1)

## 2024-06-11 LAB — URINALYSIS, ROUTINE W REFLEX MICROSCOPIC
Bilirubin Urine: NEGATIVE
Glucose, UA: NEGATIVE mg/dL
Hgb urine dipstick: NEGATIVE
Ketones, ur: NEGATIVE mg/dL
Leukocytes,Ua: NEGATIVE
Nitrite: NEGATIVE
Protein, ur: NEGATIVE mg/dL
Specific Gravity, Urine: 1.017 (ref 1.005–1.030)
pH: 5 (ref 5.0–8.0)

## 2024-06-11 LAB — LIPASE, BLOOD: Lipase: 38 U/L (ref 11–51)

## 2024-06-11 NOTE — ED Triage Notes (Signed)
 Pt reports abd pain/constipation x1 week. Pt seen previously by his PCP for same. Pt states its all in my paperwork. They want me to have some type of scan done.

## 2024-06-11 NOTE — ED Provider Notes (Signed)
 [place] EMERGENC[facility] DEPARTMENT AT [facility] Provider Note   CSN: [id number] Arrival date & time: 06/11/24  8371     Patient presents with: Abdominal Pain   Roger Savage is a 73 y.o. male.    Abdominal Pain Patient has abdominal pain.  Has had for couple weeks now.  Had been seen by PCP around 2 weeks ago.  Had CT scan ordered but appears to have not been done.  Patient states he was out of town.  Also had been treated for UTI.  Continued pain.    Past Medical History:  Diagnosis Date   Anxiety    Depression    Prostate cancer (HCC)    Seizures (HCC)    Sleep deprivation    Past Surgical History:  Procedure Laterality Date   LAPAROTOMY N/A 06/04/2016   Procedure: EXPLORATORY LAPAROTOMY;  Surgeon: Deward Null III, MD;  Location: [facility] OR;  Service: General;  Laterality: N/A;   PROSTATE SURGERY      Prior to Admission medications   Medication Sig Start Date End Date Taking? Authorizing Provider  acetaminophen  (TYLENOL ) 325 MG tablet Take 650 mg by mouth every 6 (six) hours as needed for mild pain or moderate pain.    [provider]  albuterol  (PROVENTIL  HFA;VENTOLIN  HFA) 108 (90 BASE) MCG/ACT inhaler Inhale 2 puffs into the lungs every 6 (six) hours as needed for wheezing or shortness of breath.     [provider]  Cholecalciferol  (VITAMIN D3) 2000 UNITS TABS Take 2,000 Units by mouth daily.    [provider]  clonazePAM (KLONOPIN) 0.5 MG tablet Take 0.5 mg by mouth 3 (three) times daily as needed for anxiety.    [provider]  Docusate Sodium (DSS) 100 MG CAPS Take 200 mg by mouth daily as needed for constipation.    [provider]  mirtazapine (REMERON) 30 MG tablet Take 30 mg by mouth at bedtime.    [provider]  omeprazole (PRILOSEC) 20 MG capsule Take 40 mg by mouth daily.    [provider]  oxybutynin  (DITROPAN ) 5 MG tablet Take 5 mg by mouth 3 (three) times daily.     [provider]  phenazopyridine  (PYRIDIUM ) 100 MG tablet Take 1 tablet (100 mg total) by mouth 3 (three) times daily as needed for pain. Patient not taking: Reported on 09/03/2020 12/08/16   Lucila Delon BROCKS, NP  prazosin (MINIPRESS) 1 MG capsule Take 1 mg by mouth at bedtime.    [provider]  QUEtiapine (SEROQUEL) 100 MG tablet Take 50 mg by mouth at bedtime. 04/14/19   [provider]  sertraline (ZOLOFT) 100 MG tablet Take 200 mg by mouth daily.    [provider]  traMADol  (ULTRAM ) 50 MG tablet Take 2 tablets (100 mg total) by mouth every 6 (six) hours as needed. Patient not taking: Reported on 09/03/2020 08/26/16   Charlyn Sora, MD  traZODone  (DESYREL ) 150 MG tablet Take 150 mg by mouth at bedtime as needed for sleep.     [provider]    Allergies: Patient has no known allergies.    Review of Systems  Gastrointestinal:  Positive for abdominal pain.    Updated Vital Signs BP (!) 166/74 (BP Location: Left Arm)   Pulse (!) 49   Temp 97.8 F (36.6 C) (Oral)   Resp 16   Ht 5' 9 (1.753 m)   Wt 70.3 kg   SpO2 99%   BMI 22.89 kg/m  Physical Exam Vitals and nursing note reviewed.  Cardiovascular:     Rate and Rhythm: Normal rate.  Abdominal:     Tenderness: There is abdominal tenderness.     Comments: Lower abdominal tenderness without rebound or guarding.  No hernia palpated  Neurological:     Mental Status: He is alert.     (all labs ordered are listed, but only abnormal results are displayed) Labs Reviewed  COMPREHENSIVE METABOLIC PANEL WITH GFR - Abnormal; Notable for the following components:      Result Value   Glucose, Bld 102 (*)    All other components within normal limits  CBC - Abnormal; Notable for the following components:   WBC 3.6 (*)    All other components within normal limits  LIPASE, BLOOD  URINALYSIS, ROUTINE W REFLEX MICROSCOPIC    EKG: None  Radiology: No results found.   Procedures    Medications Ordered in the ED - No data to display                                  Medical Decision Making Amount and/or Complexity of Data Reviewed Labs: ordered. Radiology: ordered.   Patient with abdominal pain.  Has had for the last couple weeks.  Recently treated for UTI.  Had CT scan previously ordered but does not appear that was done.  Differential diagnosis includes's such as UTI, diverticulitis.  Will get CT scan to further evaluate.  Care turned over to Dr Raford.  I have reviewed note from PCP.     Final diagnoses:  None    ED Discharge Orders     None          Patsey Lot, MD 06/11/24 2330

## 2024-06-12 ENCOUNTER — Emergency Department (HOSPITAL_COMMUNITY)

## 2024-06-12 MED ORDER — MORPHINE SULFATE (PF) 4 MG/ML IV SOLN
4.0000 mg | Freq: Once | INTRAVENOUS | Status: AC
  Administered 2024-06-12: 4 mg via INTRAVENOUS
  Filled 2024-06-12: qty 1

## 2024-06-12 MED ORDER — IOHEXOL 300 MG/ML  SOLN
100.0000 mL | Freq: Once | INTRAMUSCULAR | Status: AC | PRN
Start: 2024-06-12 — End: 2024-06-12
  Administered 2024-06-12: 100 mL via INTRAVENOUS

## 2024-06-12 MED ORDER — OXYCODONE HCL 5 MG PO TABS: 5.0000 mg | ORAL_TABLET | ORAL | 0 refills | Status: AC | PRN

## 2024-06-12 NOTE — ED Notes (Signed)
 Patient alert oriented x 4 with stable gait at this time. Patient d/c home with home care instructions.

## 2024-06-12 NOTE — Discharge Instructions (Addendum)
 Apply ice to the sore area.  Ice should be applied for 30 minutes at a time, 4 times a day.  Take acetaminophen  and/or ibuprofen as needed for pain.  Please note that if you combine acetaminophen  and ibuprofen, you will get better pain relief and you get from taking either medication by itself.  Take oxycodone  for pain that is not relieved by the combination of acetaminophen  and ibuprofen.  Return to the emergency department if pain is not being adequately controlled, you start running a fever, or if you start vomiting.  It is very important that you follow-up with your urologist and with the surgeon who put the mesh in.

## 2024-06-12 NOTE — ED Provider Notes (Signed)
 Care assumed from Dr. Patsey, patient with lower abdominal pain with CT abdomen/pelvis pending. Labs are unremarkable, urinalysis normal.  CT shows stranding and trace fluid around the left inguinal canal with possible small fat-containing left inguinal hernia.  By CT there was concern for strangulated/incarcerated hernia.  I have reviewed the CT scan images, and I agree with the radiologist interpretation.  I have evaluated the patient and he is clearly tender in that area but I cannot actually palpate a hernia.  Clinically, he does not have an incarcerated or strangulated hernia.  I have discussed case with Dr. Sheldon, on-call for general surgery.  He has reviewed the CT scan and is not concerned about incarcerated or strangulated hernia and feels that he can be treated with analgesics on a as needed basis and will need to follow-up with his surgeons at the [facility].  I am discharging the patient with instructions apply ice and use over-the-counter acetaminophen  and ibuprofen.  A ordered a prescription for a small number of oxycodone  tablets to use for breakthrough pain.  I have given strict return precautions for uncontrolled pain, fever, vomiting.  Results for orders placed or performed during the hospital encounter of 06/11/24  Lipase, blood   Collection Time: 06/11/24  5:55 PM  Result Value Ref Range   Lipase 38 11 - 51 U/L  Comprehensive metabolic panel   Collection Time: 06/11/24  5:55 PM  Result Value Ref Range   Sodium 140 135 - 145 mmol/L   Potassium 4.3 3.5 - 5.1 mmol/L   Chloride 107 98 - 111 mmol/L   CO2 23 22 - 32 mmol/L   Glucose, Bld 102 (H) 70 - 99 mg/dL   BUN 14 8 - 23 mg/dL   Creatinine, Ser 8.88 0.61 - 1.24 mg/dL   Calcium 9.5 8.9 - 89.6 mg/dL   Total Protein 7.6 6.5 - 8.1 g/dL   Albumin 4.5 3.5 - 5.0 g/dL   AST 23 15 - 41 U/L   ALT 16 0 - 44 U/L   Alkaline Phosphatase 96 38 - 126 U/L   Total Bilirubin 0.7 0.0 - 1.2 mg/dL   GFR, Estimated >39 >39 mL/min   Anion  gap 10 5 - 15  CBC   Collection Time: 06/11/24  5:55 PM  Result Value Ref Range   WBC 3.6 (L) 4.0 - 10.5 K/uL   RBC 4.24 4.22 - 5.81 MIL/uL   Hemoglobin 13.9 13.0 - 17.0 g/dL   HCT 60.5 60.9 - 47.9 %   MCV 92.9 80.0 - 100.0 fL   MCH 32.8 26.0 - 34.0 pg   MCHC 35.3 30.0 - 36.0 g/dL   RDW 86.9 88.4 - 84.4 %   Platelets 159 150 - 400 K/uL   nRBC 0.0 0.0 - 0.2 %  Urinalysis, Routine w reflex microscopic -Urine, Clean Catch   Collection Time: 06/11/24 11:35 PM  Result Value Ref Range   Color, Urine YELLOW YELLOW   APPearance CLEAR CLEAR   Specific Gravity, Urine 1.017 1.005 - 1.030   pH 5.0 5.0 - 8.0   Glucose, UA NEGATIVE NEGATIVE mg/dL   Hgb urine dipstick NEGATIVE NEGATIVE   Bilirubin Urine NEGATIVE NEGATIVE   Ketones, ur NEGATIVE NEGATIVE mg/dL   Protein, ur NEGATIVE NEGATIVE mg/dL   Nitrite NEGATIVE NEGATIVE   Leukocytes,Ua NEGATIVE NEGATIVE   CT ABDOMEN PELVIS W CONTRAST Result Date: 06/12/2024 CLINICAL DATA:  Left lower quadrant abdominal pain EXAM: CT ABDOMEN AND PELVIS WITH CONTRAST TECHNIQUE: Multidetector CT imaging of the  abdomen and pelvis was performed using the standard protocol following bolus administration of intravenous contrast. RADIATION DOSE REDUCTION: This exam was performed according to the departmental dose-optimization program which includes automated exposure control, adjustment of the mA and/or kV according to patient size and/or use of iterative reconstruction technique. CONTRAST:  OMNIPAQUE  IOHEXOL  300 MG/ML  SOLN COMPARISON:  10/16/2016 FINDINGS: Lower chest: No acute abnormality. Hepatobiliary: Hepatic steatosis. Normal gallbladder. No biliary dilation. Pancreas: Unremarkable. Spleen: Unremarkable. Adrenals/Urinary Tract: Normal adrenal glands. No urinary calculi or hydronephrosis. Bladder is unremarkable. Stomach/Bowel: Normal caliber large and small bowel. No bowel wall thickening. The appendix is normal.Stomach is within normal limits.  Vascular/Lymphatic: Aortic atherosclerosis. No enlarged abdominal or pelvic lymph nodes. Reproductive: Penile prosthesis.  Prostatectomy. Other: No free intraperitoneal fluid or air. Postoperative change of ventral hernia repair. Stranding and trace fluid about the left inguinal canal. There may be a very small fat containing left inguinal hernia. Correlate for strangulated/incarcerated hernia (series 2/image [id number]). Musculoskeletal: No acute fracture.  Bilateral femoral head AVN. IMPRESSION: 1. Stranding and trace fluid about the left inguinal canal. There may be a very small fat containing left inguinal hernia. Correlate for strangulated/incarcerated hernia. 2. Hepatic steatosis. 3. Bilateral femoral head AVN. 4. Aortic Atherosclerosis (ICD10-I70.0). Electronically Signed   By: Norman Gatlin M.D.   On: 06/12/2024 01:00      Raford Lenis, MD 06/12/24 [phone]

## 2024-06-12 NOTE — ED Notes (Signed)
 Pending d/c due to patient getting morphine .
